# Patient Record
Sex: Female | Born: 1955 | Race: Black or African American | Hispanic: No | Marital: Single | Smoking: Former smoker
Health system: Southern US, Community
[De-identification: ages and names within clinical notes are randomized; demographics above are authoritative.]

## PROBLEM LIST (undated history)

## (undated) HISTORY — PX: BREAST EXCISIONAL BIOPSY: SUR124

---

## 1999-07-05 ENCOUNTER — Encounter: Admission: RE | Admit: 1999-07-05 | Discharge: 1999-07-05 | Payer: Self-pay

## 1999-07-11 ENCOUNTER — Encounter (INDEPENDENT_AMBULATORY_CARE_PROVIDER_SITE_OTHER): Payer: Self-pay | Admitting: *Deleted

## 1999-07-11 ENCOUNTER — Encounter: Admission: RE | Admit: 1999-07-11 | Discharge: 1999-07-11 | Payer: Self-pay | Admitting: Internal Medicine

## 1999-07-11 ENCOUNTER — Ambulatory Visit (HOSPITAL_COMMUNITY): Admission: RE | Admit: 1999-07-11 | Discharge: 1999-07-11 | Payer: Self-pay | Admitting: Internal Medicine

## 1999-07-11 LAB — CONVERTED CEMR LAB
CD4 Count: 260 microliters
CD4 T Cell Abs: 260

## 1999-08-16 ENCOUNTER — Encounter: Admission: RE | Admit: 1999-08-16 | Discharge: 1999-08-16 | Payer: Self-pay | Admitting: Infectious Diseases

## 1999-08-16 ENCOUNTER — Ambulatory Visit (HOSPITAL_COMMUNITY): Admission: RE | Admit: 1999-08-16 | Discharge: 1999-08-16 | Payer: Self-pay | Admitting: Internal Medicine

## 1999-08-16 ENCOUNTER — Encounter: Payer: Self-pay | Admitting: Infectious Diseases

## 1999-09-20 ENCOUNTER — Encounter: Admission: RE | Admit: 1999-09-20 | Discharge: 1999-09-20 | Payer: Self-pay | Admitting: Infectious Diseases

## 1999-09-20 ENCOUNTER — Ambulatory Visit (HOSPITAL_COMMUNITY): Admission: RE | Admit: 1999-09-20 | Discharge: 1999-09-20 | Payer: Self-pay | Admitting: Infectious Diseases

## 1999-10-04 ENCOUNTER — Encounter: Admission: RE | Admit: 1999-10-04 | Discharge: 1999-10-04 | Payer: Self-pay | Admitting: Infectious Diseases

## 1999-11-22 ENCOUNTER — Ambulatory Visit (HOSPITAL_COMMUNITY): Admission: RE | Admit: 1999-11-22 | Discharge: 1999-11-22 | Payer: Self-pay | Admitting: Infectious Diseases

## 1999-11-22 ENCOUNTER — Encounter: Admission: RE | Admit: 1999-11-22 | Discharge: 1999-11-22 | Payer: Self-pay | Admitting: Infectious Diseases

## 1999-12-06 ENCOUNTER — Encounter: Admission: RE | Admit: 1999-12-06 | Discharge: 1999-12-06 | Payer: Self-pay | Admitting: Infectious Diseases

## 2000-02-21 ENCOUNTER — Encounter: Admission: RE | Admit: 2000-02-21 | Discharge: 2000-02-21 | Payer: Self-pay | Admitting: Internal Medicine

## 2000-02-21 ENCOUNTER — Ambulatory Visit (HOSPITAL_COMMUNITY): Admission: RE | Admit: 2000-02-21 | Discharge: 2000-02-21 | Payer: Self-pay | Admitting: Infectious Diseases

## 2000-04-02 ENCOUNTER — Encounter: Admission: RE | Admit: 2000-04-02 | Discharge: 2000-04-02 | Payer: Self-pay | Admitting: Infectious Diseases

## 2000-04-10 ENCOUNTER — Ambulatory Visit (HOSPITAL_COMMUNITY): Admission: RE | Admit: 2000-04-10 | Discharge: 2000-04-10 | Payer: Self-pay | Admitting: Infectious Diseases

## 2000-04-10 ENCOUNTER — Encounter: Admission: RE | Admit: 2000-04-10 | Discharge: 2000-04-10 | Payer: Self-pay | Admitting: Infectious Diseases

## 2000-04-10 ENCOUNTER — Encounter (INDEPENDENT_AMBULATORY_CARE_PROVIDER_SITE_OTHER): Payer: Self-pay | Admitting: *Deleted

## 2000-04-10 LAB — CONVERTED CEMR LAB: Absolute CD4: 180 #/uL

## 2000-05-08 ENCOUNTER — Encounter: Admission: RE | Admit: 2000-05-08 | Discharge: 2000-05-08 | Payer: Self-pay | Admitting: Infectious Diseases

## 2000-06-19 ENCOUNTER — Encounter: Admission: RE | Admit: 2000-06-19 | Discharge: 2000-06-19 | Payer: Self-pay | Admitting: Infectious Diseases

## 2000-06-19 ENCOUNTER — Ambulatory Visit (HOSPITAL_COMMUNITY): Admission: RE | Admit: 2000-06-19 | Discharge: 2000-06-19 | Payer: Self-pay | Admitting: Infectious Diseases

## 2000-07-03 ENCOUNTER — Encounter: Admission: RE | Admit: 2000-07-03 | Discharge: 2000-07-03 | Payer: Self-pay | Admitting: Infectious Diseases

## 2000-09-03 ENCOUNTER — Ambulatory Visit (HOSPITAL_COMMUNITY): Admission: RE | Admit: 2000-09-03 | Discharge: 2000-09-03 | Payer: Self-pay | Admitting: Infectious Diseases

## 2000-09-03 ENCOUNTER — Encounter: Admission: RE | Admit: 2000-09-03 | Discharge: 2000-09-03 | Payer: Self-pay | Admitting: Infectious Diseases

## 2000-09-17 ENCOUNTER — Encounter: Admission: RE | Admit: 2000-09-17 | Discharge: 2000-09-17 | Payer: Self-pay | Admitting: Infectious Diseases

## 2000-11-13 ENCOUNTER — Encounter: Admission: RE | Admit: 2000-11-13 | Discharge: 2000-11-13 | Payer: Self-pay | Admitting: Internal Medicine

## 2000-12-17 ENCOUNTER — Encounter: Admission: RE | Admit: 2000-12-17 | Discharge: 2000-12-17 | Payer: Self-pay | Admitting: Infectious Diseases

## 2001-01-14 ENCOUNTER — Ambulatory Visit (HOSPITAL_COMMUNITY): Admission: RE | Admit: 2001-01-14 | Discharge: 2001-01-14 | Payer: Self-pay | Admitting: Infectious Diseases

## 2001-01-14 ENCOUNTER — Encounter: Admission: RE | Admit: 2001-01-14 | Discharge: 2001-01-14 | Payer: Self-pay | Admitting: Infectious Diseases

## 2001-01-14 ENCOUNTER — Encounter: Admission: RE | Admit: 2001-01-14 | Discharge: 2001-01-14 | Payer: Self-pay | Admitting: Internal Medicine

## 2001-03-04 ENCOUNTER — Encounter: Admission: RE | Admit: 2001-03-04 | Discharge: 2001-03-04 | Payer: Self-pay | Admitting: Infectious Diseases

## 2001-03-04 ENCOUNTER — Ambulatory Visit (HOSPITAL_COMMUNITY): Admission: RE | Admit: 2001-03-04 | Discharge: 2001-03-04 | Payer: Self-pay | Admitting: Infectious Diseases

## 2001-03-18 ENCOUNTER — Encounter: Admission: RE | Admit: 2001-03-18 | Discharge: 2001-03-18 | Payer: Self-pay | Admitting: Infectious Diseases

## 2001-04-27 ENCOUNTER — Ambulatory Visit (HOSPITAL_COMMUNITY): Admission: RE | Admit: 2001-04-27 | Discharge: 2001-04-27 | Payer: Self-pay | Admitting: Infectious Diseases

## 2001-04-27 ENCOUNTER — Encounter: Admission: RE | Admit: 2001-04-27 | Discharge: 2001-04-27 | Payer: Self-pay | Admitting: Infectious Diseases

## 2001-05-13 ENCOUNTER — Encounter: Admission: RE | Admit: 2001-05-13 | Discharge: 2001-05-13 | Payer: Self-pay | Admitting: Infectious Diseases

## 2001-08-30 ENCOUNTER — Ambulatory Visit (HOSPITAL_COMMUNITY): Admission: RE | Admit: 2001-08-30 | Discharge: 2001-08-30 | Payer: Self-pay | Admitting: Infectious Diseases

## 2001-08-30 ENCOUNTER — Encounter: Admission: RE | Admit: 2001-08-30 | Discharge: 2001-08-30 | Payer: Self-pay | Admitting: Internal Medicine

## 2001-09-09 ENCOUNTER — Encounter: Admission: RE | Admit: 2001-09-09 | Discharge: 2001-09-09 | Payer: Self-pay | Admitting: Infectious Diseases

## 2001-11-23 ENCOUNTER — Ambulatory Visit (HOSPITAL_COMMUNITY): Admission: RE | Admit: 2001-11-23 | Discharge: 2001-11-23 | Payer: Self-pay | Admitting: Infectious Diseases

## 2001-11-23 ENCOUNTER — Encounter: Admission: RE | Admit: 2001-11-23 | Discharge: 2001-11-23 | Payer: Self-pay | Admitting: Infectious Diseases

## 2001-12-20 ENCOUNTER — Encounter: Admission: RE | Admit: 2001-12-20 | Discharge: 2001-12-20 | Payer: Self-pay | Admitting: Internal Medicine

## 2001-12-20 ENCOUNTER — Ambulatory Visit (HOSPITAL_COMMUNITY): Admission: RE | Admit: 2001-12-20 | Discharge: 2001-12-20 | Payer: Self-pay | Admitting: Infectious Diseases

## 2002-03-04 ENCOUNTER — Encounter: Admission: RE | Admit: 2002-03-04 | Discharge: 2002-03-04 | Payer: Self-pay | Admitting: Infectious Diseases

## 2002-03-07 ENCOUNTER — Encounter: Admission: RE | Admit: 2002-03-07 | Discharge: 2002-03-07 | Payer: Self-pay | Admitting: Infectious Diseases

## 2002-05-05 ENCOUNTER — Encounter: Admission: RE | Admit: 2002-05-05 | Discharge: 2002-05-05 | Payer: Self-pay | Admitting: Infectious Diseases

## 2002-08-01 ENCOUNTER — Ambulatory Visit (HOSPITAL_COMMUNITY): Admission: RE | Admit: 2002-08-01 | Discharge: 2002-08-01 | Payer: Self-pay | Admitting: Infectious Diseases

## 2002-08-01 ENCOUNTER — Encounter: Admission: RE | Admit: 2002-08-01 | Discharge: 2002-08-01 | Payer: Self-pay | Admitting: Infectious Diseases

## 2002-08-11 ENCOUNTER — Encounter: Admission: RE | Admit: 2002-08-11 | Discharge: 2002-08-11 | Payer: Self-pay | Admitting: Infectious Diseases

## 2002-11-09 ENCOUNTER — Encounter: Payer: Self-pay | Admitting: Infectious Diseases

## 2002-11-09 ENCOUNTER — Encounter: Admission: RE | Admit: 2002-11-09 | Discharge: 2002-11-09 | Payer: Self-pay | Admitting: Infectious Diseases

## 2002-11-24 ENCOUNTER — Encounter: Admission: RE | Admit: 2002-11-24 | Discharge: 2002-11-24 | Payer: Self-pay | Admitting: Infectious Diseases

## 2003-03-09 ENCOUNTER — Ambulatory Visit (HOSPITAL_COMMUNITY): Admission: RE | Admit: 2003-03-09 | Discharge: 2003-03-09 | Payer: Self-pay | Admitting: Infectious Diseases

## 2003-03-09 ENCOUNTER — Encounter: Admission: RE | Admit: 2003-03-09 | Discharge: 2003-03-09 | Payer: Self-pay | Admitting: Infectious Diseases

## 2003-05-08 ENCOUNTER — Encounter: Admission: RE | Admit: 2003-05-08 | Discharge: 2003-05-08 | Payer: Self-pay | Admitting: Infectious Diseases

## 2003-05-08 ENCOUNTER — Ambulatory Visit (HOSPITAL_COMMUNITY): Admission: RE | Admit: 2003-05-08 | Discharge: 2003-05-08 | Payer: Self-pay | Admitting: Infectious Diseases

## 2003-06-08 ENCOUNTER — Encounter: Admission: RE | Admit: 2003-06-08 | Discharge: 2003-06-08 | Payer: Self-pay | Admitting: Infectious Diseases

## 2003-07-27 ENCOUNTER — Encounter: Admission: RE | Admit: 2003-07-27 | Discharge: 2003-07-27 | Payer: Self-pay | Admitting: Infectious Diseases

## 2003-10-11 ENCOUNTER — Ambulatory Visit (HOSPITAL_COMMUNITY): Admission: RE | Admit: 2003-10-11 | Discharge: 2003-10-11 | Payer: Self-pay | Admitting: Infectious Diseases

## 2003-10-11 ENCOUNTER — Encounter: Admission: RE | Admit: 2003-10-11 | Discharge: 2003-10-11 | Payer: Self-pay | Admitting: Infectious Diseases

## 2003-10-25 ENCOUNTER — Encounter: Admission: RE | Admit: 2003-10-25 | Discharge: 2003-10-25 | Payer: Self-pay | Admitting: Infectious Diseases

## 2003-11-06 ENCOUNTER — Ambulatory Visit (HOSPITAL_COMMUNITY): Admission: RE | Admit: 2003-11-06 | Discharge: 2003-11-06 | Payer: Self-pay | Admitting: Infectious Diseases

## 2003-12-14 ENCOUNTER — Encounter: Admission: RE | Admit: 2003-12-14 | Discharge: 2003-12-14 | Payer: Self-pay | Admitting: Infectious Diseases

## 2003-12-14 ENCOUNTER — Ambulatory Visit (HOSPITAL_COMMUNITY): Admission: RE | Admit: 2003-12-14 | Discharge: 2003-12-14 | Payer: Self-pay | Admitting: Infectious Diseases

## 2003-12-28 ENCOUNTER — Encounter: Admission: RE | Admit: 2003-12-28 | Discharge: 2003-12-28 | Payer: Self-pay | Admitting: Infectious Diseases

## 2004-03-21 ENCOUNTER — Ambulatory Visit (HOSPITAL_COMMUNITY): Admission: RE | Admit: 2004-03-21 | Discharge: 2004-03-21 | Payer: Self-pay | Admitting: Internal Medicine

## 2004-03-21 ENCOUNTER — Ambulatory Visit: Payer: Self-pay | Admitting: Infectious Diseases

## 2004-04-04 ENCOUNTER — Ambulatory Visit: Payer: Self-pay | Admitting: Infectious Diseases

## 2004-08-05 ENCOUNTER — Ambulatory Visit (HOSPITAL_COMMUNITY): Admission: RE | Admit: 2004-08-05 | Discharge: 2004-08-05 | Payer: Self-pay | Admitting: Infectious Diseases

## 2004-08-05 ENCOUNTER — Ambulatory Visit: Payer: Self-pay | Admitting: Infectious Diseases

## 2004-08-22 ENCOUNTER — Ambulatory Visit: Payer: Self-pay | Admitting: Infectious Diseases

## 2004-12-02 ENCOUNTER — Ambulatory Visit: Payer: Self-pay | Admitting: Infectious Diseases

## 2004-12-02 ENCOUNTER — Ambulatory Visit (HOSPITAL_COMMUNITY): Admission: RE | Admit: 2004-12-02 | Discharge: 2004-12-02 | Payer: Self-pay | Admitting: Infectious Diseases

## 2004-12-19 ENCOUNTER — Ambulatory Visit: Payer: Self-pay | Admitting: Infectious Diseases

## 2005-03-31 ENCOUNTER — Ambulatory Visit: Payer: Self-pay | Admitting: Infectious Diseases

## 2005-03-31 ENCOUNTER — Ambulatory Visit (HOSPITAL_COMMUNITY): Admission: RE | Admit: 2005-03-31 | Discharge: 2005-03-31 | Payer: Self-pay | Admitting: Infectious Diseases

## 2005-04-24 ENCOUNTER — Ambulatory Visit: Payer: Self-pay | Admitting: Infectious Diseases

## 2005-05-13 ENCOUNTER — Ambulatory Visit (HOSPITAL_COMMUNITY): Admission: RE | Admit: 2005-05-13 | Discharge: 2005-05-13 | Payer: Self-pay | Admitting: Infectious Diseases

## 2005-08-11 ENCOUNTER — Ambulatory Visit: Payer: Self-pay | Admitting: Infectious Diseases

## 2005-08-28 ENCOUNTER — Ambulatory Visit: Payer: Self-pay | Admitting: Infectious Diseases

## 2005-12-29 ENCOUNTER — Encounter (INDEPENDENT_AMBULATORY_CARE_PROVIDER_SITE_OTHER): Payer: Self-pay | Admitting: *Deleted

## 2005-12-29 ENCOUNTER — Encounter: Admission: RE | Admit: 2005-12-29 | Discharge: 2005-12-29 | Payer: Self-pay | Admitting: Infectious Diseases

## 2005-12-29 ENCOUNTER — Ambulatory Visit: Payer: Self-pay | Admitting: Infectious Diseases

## 2005-12-29 LAB — CONVERTED CEMR LAB
CD4 Count: 630 microliters
HIV 1 RNA Quant: 399 copies/mL

## 2006-01-08 ENCOUNTER — Ambulatory Visit: Payer: Self-pay | Admitting: Infectious Diseases

## 2006-04-16 ENCOUNTER — Encounter (INDEPENDENT_AMBULATORY_CARE_PROVIDER_SITE_OTHER): Payer: Self-pay | Admitting: *Deleted

## 2006-04-16 ENCOUNTER — Ambulatory Visit: Payer: Self-pay | Admitting: Infectious Diseases

## 2006-04-16 ENCOUNTER — Encounter: Admission: RE | Admit: 2006-04-16 | Discharge: 2006-04-16 | Payer: Self-pay | Admitting: Infectious Diseases

## 2006-04-16 LAB — CONVERTED CEMR LAB
ALT: 25 units/L (ref 0–40)
AST: 22 units/L (ref 0–37)
Albumin: 4.6 g/dL (ref 3.5–5.2)
Alkaline Phosphatase: 138 units/L — ABNORMAL HIGH (ref 39–117)
BUN: 13 mg/dL (ref 6–23)
Basophils Absolute: 0 10*3/uL (ref 0.0–0.1)
Basophils percent auto: 1 % (ref 0–1)
CD4 Count: 880 microliters
CO2: 26 meq/L (ref 19–32)
Calcium: 9.2 mg/dL (ref 8.4–10.5)
Chloride: 104 meq/L (ref 96–112)
Creatinine, Ser: 0.61 mg/dL (ref 0.40–1.20)
Eosinophils Absolute: 0.1 cells/mcL (ref 0.0–0.7)
Eosinophils Relative: 1 % (ref 0–5)
Glucose, Bld: 94 mg/dL (ref 70–99)
HCT: 40.5 % (ref 36.0–46.0)
HIV 1 RNA Quant: 82 copies/mL
HIV 1 RNA Quant: 82 copies/mL — ABNORMAL HIGH (ref <50)
HIV-1 RNA Quant, Log: 1.91 — ABNORMAL HIGH (ref <1.70)
Hemoglobin: 13.1 g/dL (ref 12.0–15.0)
Leukocyte count, blood: 6.7 10*9/L (ref 4.0–10.5)
Lymphocytes Relative: 42 % (ref 12–46)
Lymphs Abs: 2.8 10*3/uL (ref 0.7–3.3)
MCHC: 32.3 g/dL (ref 30.0–36.0)
MCV: 102.8 fL — ABNORMAL HIGH (ref 78.0–100.0)
Monocytes Absolute: 0.4 10*3/uL (ref 0.2–0.7)
Monocytes Relative: 5 % (ref 3–11)
Neutro Abs: 3.5 10*3/uL (ref 1.7–7.7)
Neutrophils Relative %: 51 % (ref 43–77)
Platelets: 283 10*3/uL (ref 150–400)
Potassium: 3.7 meq/L (ref 3.5–5.3)
RBC: 3.94 M/uL (ref 3.87–5.11)
RDW: 13.1 % (ref 11.5–14.0)
Sodium: 140 meq/L (ref 135–145)
Total Bilirubin: 0.4 mg/dL (ref 0.3–1.2)
Total Protein: 7.4 g/dL (ref 6.0–8.3)

## 2006-04-30 ENCOUNTER — Ambulatory Visit: Payer: Self-pay | Admitting: Infectious Diseases

## 2006-06-08 ENCOUNTER — Ambulatory Visit (HOSPITAL_COMMUNITY): Admission: RE | Admit: 2006-06-08 | Discharge: 2006-06-08 | Payer: Self-pay | Admitting: Obstetrics and Gynecology

## 2006-08-17 ENCOUNTER — Encounter: Admission: RE | Admit: 2006-08-17 | Discharge: 2006-08-17 | Payer: Self-pay | Admitting: Infectious Diseases

## 2006-08-17 ENCOUNTER — Ambulatory Visit: Payer: Self-pay | Admitting: Infectious Diseases

## 2006-08-17 ENCOUNTER — Encounter (INDEPENDENT_AMBULATORY_CARE_PROVIDER_SITE_OTHER): Payer: Self-pay | Admitting: *Deleted

## 2006-08-17 LAB — CONVERTED CEMR LAB
CD4 Count: 600 microliters
HIV 1 RNA Quant: 110 copies/mL

## 2006-08-24 ENCOUNTER — Encounter (INDEPENDENT_AMBULATORY_CARE_PROVIDER_SITE_OTHER): Payer: Self-pay | Admitting: *Deleted

## 2006-08-24 LAB — CONVERTED CEMR LAB

## 2006-08-28 DIAGNOSIS — E039 Hypothyroidism, unspecified: Secondary | ICD-10-CM | POA: Insufficient documentation

## 2006-08-28 DIAGNOSIS — B20 Human immunodeficiency virus [HIV] disease: Secondary | ICD-10-CM | POA: Insufficient documentation

## 2006-08-28 DIAGNOSIS — I10 Essential (primary) hypertension: Secondary | ICD-10-CM | POA: Insufficient documentation

## 2006-09-03 ENCOUNTER — Ambulatory Visit: Payer: Self-pay | Admitting: Infectious Diseases

## 2006-09-06 ENCOUNTER — Encounter (INDEPENDENT_AMBULATORY_CARE_PROVIDER_SITE_OTHER): Payer: Self-pay | Admitting: *Deleted

## 2006-12-24 ENCOUNTER — Encounter: Admission: RE | Admit: 2006-12-24 | Discharge: 2006-12-24 | Payer: Self-pay | Admitting: Infectious Diseases

## 2006-12-24 ENCOUNTER — Ambulatory Visit: Payer: Self-pay | Admitting: Infectious Diseases

## 2006-12-24 LAB — CONVERTED CEMR LAB
ALT: 26 units/L (ref 0–35)
AST: 23 units/L (ref 0–37)
Albumin: 4.3 g/dL (ref 3.5–5.2)
Alkaline Phosphatase: 139 units/L — ABNORMAL HIGH (ref 39–117)
BUN: 10 mg/dL (ref 6–23)
Basophils Absolute: 0.1 10*3/uL (ref 0.0–0.1)
Basophils Relative: 1 % (ref 0–1)
CO2: 25 meq/L (ref 19–32)
Calcium: 9.5 mg/dL (ref 8.4–10.5)
Chloride: 103 meq/L (ref 96–112)
Creatinine, Ser: 0.65 mg/dL (ref 0.40–1.20)
Eosinophils Absolute: 0.1 10*3/uL (ref 0.0–0.7)
Eosinophils Relative: 1 % (ref 0–5)
Glucose, Bld: 90 mg/dL (ref 70–99)
HCT: 40.2 % (ref 36.0–46.0)
HIV 1 RNA Quant: 359 copies/mL — ABNORMAL HIGH (ref <50)
HIV-1 RNA Quant, Log: 2.56 — ABNORMAL HIGH (ref <1.70)
Hemoglobin: 12.9 g/dL (ref 12.0–15.0)
Lymphocytes Relative: 38 % (ref 12–46)
Lymphs Abs: 2.9 10*3/uL (ref 0.7–3.3)
MCHC: 32.1 g/dL (ref 30.0–36.0)
MCV: 102.6 fL — ABNORMAL HIGH (ref 78.0–100.0)
Monocytes Absolute: 0.5 10*3/uL (ref 0.2–0.7)
Monocytes Relative: 6 % (ref 3–11)
Neutro Abs: 4.2 10*3/uL (ref 1.7–7.7)
Neutrophils Relative %: 54 % (ref 43–77)
Platelets: 339 10*3/uL (ref 150–400)
Potassium: 3.7 meq/L (ref 3.5–5.3)
RBC: 3.92 M/uL (ref 3.87–5.11)
RDW: 13.4 % (ref 11.5–14.0)
Sodium: 139 meq/L (ref 135–145)
Total Bilirubin: 0.4 mg/dL (ref 0.3–1.2)
Total Protein: 7.5 g/dL (ref 6.0–8.3)
WBC: 7.6 10*3/uL (ref 4.0–10.5)

## 2006-12-25 ENCOUNTER — Encounter: Payer: Self-pay | Admitting: Infectious Diseases

## 2006-12-25 LAB — CONVERTED CEMR LAB: CD4 Count: 840 microliters

## 2007-01-07 ENCOUNTER — Ambulatory Visit: Payer: Self-pay | Admitting: Infectious Diseases

## 2007-01-07 ENCOUNTER — Encounter (INDEPENDENT_AMBULATORY_CARE_PROVIDER_SITE_OTHER): Payer: Self-pay | Admitting: *Deleted

## 2007-01-08 ENCOUNTER — Telehealth: Payer: Self-pay | Admitting: Infectious Diseases

## 2007-05-06 ENCOUNTER — Ambulatory Visit: Payer: Self-pay | Admitting: Infectious Diseases

## 2007-05-06 ENCOUNTER — Encounter: Admission: RE | Admit: 2007-05-06 | Discharge: 2007-05-06 | Payer: Self-pay | Admitting: Infectious Diseases

## 2007-05-06 LAB — CONVERTED CEMR LAB
ALT: 17 units/L (ref 0–35)
AST: 21 units/L (ref 0–37)
Albumin: 4.4 g/dL (ref 3.5–5.2)
Alkaline Phosphatase: 144 units/L — ABNORMAL HIGH (ref 39–117)
BUN: 11 mg/dL (ref 6–23)
Basophils Absolute: 0.1 10*3/uL (ref 0.0–0.1)
Basophils Relative: 1 % (ref 0–1)
CO2: 27 meq/L (ref 19–32)
Calcium: 9.3 mg/dL (ref 8.4–10.5)
Chloride: 105 meq/L (ref 96–112)
Creatinine, Ser: 0.7 mg/dL (ref 0.40–1.20)
Eosinophils Absolute: 0 10*3/uL (ref 0.0–0.7)
Eosinophils Relative: 0 % (ref 0–5)
Glucose, Bld: 88 mg/dL (ref 70–99)
HCT: 39.3 % (ref 36.0–46.0)
HIV 1 RNA Quant: 562 copies/mL — ABNORMAL HIGH (ref <50)
HIV-1 RNA Quant, Log: 2.75 — ABNORMAL HIGH (ref <1.70)
Hemoglobin: 13 g/dL (ref 12.0–15.0)
Lymphocytes Relative: 31 % (ref 12–46)
Lymphs Abs: 2.4 10*3/uL (ref 0.7–3.3)
MCHC: 33.1 g/dL (ref 30.0–36.0)
MCV: 101.8 fL — ABNORMAL HIGH (ref 78.0–100.0)
Monocytes Absolute: 0.5 10*3/uL (ref 0.2–0.7)
Monocytes Relative: 7 % (ref 3–11)
Neutro Abs: 4.8 10*3/uL (ref 1.7–7.7)
Neutrophils Relative %: 62 % (ref 43–77)
Platelets: 316 10*3/uL (ref 150–400)
Potassium: 4 meq/L (ref 3.5–5.3)
RBC: 3.86 M/uL — ABNORMAL LOW (ref 3.87–5.11)
RDW: 13.1 % (ref 11.5–14.0)
Sodium: 143 meq/L (ref 135–145)
Total Bilirubin: 0.4 mg/dL (ref 0.3–1.2)
Total Protein: 7.6 g/dL (ref 6.0–8.3)
WBC: 7.7 10*3/uL (ref 4.0–10.5)

## 2007-05-20 ENCOUNTER — Ambulatory Visit: Payer: Self-pay | Admitting: Infectious Diseases

## 2007-05-20 LAB — CONVERTED CEMR LAB

## 2007-06-14 ENCOUNTER — Ambulatory Visit (HOSPITAL_COMMUNITY): Admission: RE | Admit: 2007-06-14 | Discharge: 2007-06-14 | Payer: Self-pay | Admitting: Infectious Diseases

## 2007-09-06 ENCOUNTER — Ambulatory Visit: Payer: Self-pay | Admitting: Infectious Diseases

## 2007-09-06 ENCOUNTER — Encounter: Admission: RE | Admit: 2007-09-06 | Discharge: 2007-09-06 | Payer: Self-pay | Admitting: Infectious Diseases

## 2007-09-06 LAB — CONVERTED CEMR LAB
ALT: 27 units/L (ref 0–35)
AST: 30 units/L (ref 0–37)
Albumin: 4.5 g/dL (ref 3.5–5.2)
Alkaline Phosphatase: 156 units/L — ABNORMAL HIGH (ref 39–117)
BUN: 18 mg/dL (ref 6–23)
Basophils Absolute: 0 10*3/uL (ref 0.0–0.1)
Basophils Relative: 0 % (ref 0–1)
Bilirubin Urine: NEGATIVE
CO2: 23 meq/L (ref 19–32)
Calcium: 9.1 mg/dL (ref 8.4–10.5)
Chloride: 105 meq/L (ref 96–112)
Cholesterol: 248 mg/dL — ABNORMAL HIGH (ref 0–200)
Creatinine, Ser: 0.84 mg/dL (ref 0.40–1.20)
Eosinophils Absolute: 0 10*3/uL (ref 0.0–0.7)
Eosinophils Relative: 1 % (ref 0–5)
Glucose, Bld: 88 mg/dL (ref 70–99)
HCT: 41.2 % (ref 36.0–46.0)
HDL: 99 mg/dL (ref >39)
HIV 1 RNA Quant: 220 copies/mL — ABNORMAL HIGH (ref <50)
HIV-1 RNA Quant, Log: 2.34 — ABNORMAL HIGH (ref <1.70)
Hemoglobin: 12.8 g/dL (ref 12.0–15.0)
Ketones, ur: NEGATIVE mg/dL
LDL Cholesterol: 125 mg/dL — ABNORMAL HIGH (ref 0–99)
Lymphocytes Relative: 31 % (ref 12–46)
Lymphs Abs: 2.5 10*3/uL (ref 0.7–4.0)
MCHC: 31.1 g/dL (ref 30.0–36.0)
MCV: 105.9 fL — ABNORMAL HIGH (ref 78.0–100.0)
Monocytes Absolute: 0.4 10*3/uL (ref 0.1–1.0)
Monocytes Relative: 5 % (ref 3–12)
Neutro Abs: 5.1 10*3/uL (ref 1.7–7.7)
Neutrophils Relative %: 63 % (ref 43–77)
Nitrite: POSITIVE — AB
Platelets: 309 10*3/uL (ref 150–400)
Potassium: 4.2 meq/L (ref 3.5–5.3)
Protein, ur: 30 mg/dL — AB
RBC: 3.89 M/uL (ref 3.87–5.11)
RDW: 13.2 % (ref 11.5–15.5)
Sodium: 140 meq/L (ref 135–145)
Specific Gravity, Urine: 1.022 (ref 1.005–1.03)
TSH: 0.89 microintl units/mL (ref 0.350–5.50)
Total Bilirubin: 0.2 mg/dL — ABNORMAL LOW (ref 0.3–1.2)
Total CHOL/HDL Ratio: 2.5
Total Protein: 7.3 g/dL (ref 6.0–8.3)
Triglycerides: 121 mg/dL (ref <150)
Urine Glucose: NEGATIVE mg/dL
Urobilinogen, UA: 0.2 (ref 0.0–1.0)
VLDL: 24 mg/dL (ref 0–40)
WBC: 8.2 10*3/uL (ref 4.0–10.5)
pH: 5.5 (ref 5.0–8.0)

## 2007-09-23 ENCOUNTER — Ambulatory Visit: Payer: Self-pay | Admitting: Infectious Diseases

## 2008-01-03 ENCOUNTER — Ambulatory Visit: Payer: Self-pay | Admitting: Infectious Diseases

## 2008-01-03 ENCOUNTER — Encounter: Admission: RE | Admit: 2008-01-03 | Discharge: 2008-01-03 | Payer: Self-pay | Admitting: Infectious Diseases

## 2008-01-03 LAB — CONVERTED CEMR LAB
ALT: 21 units/L (ref 0–35)
AST: 25 units/L (ref 0–37)
Albumin: 4.4 g/dL (ref 3.5–5.2)
Alkaline Phosphatase: 153 units/L — ABNORMAL HIGH (ref 39–117)
BUN: 13 mg/dL (ref 6–23)
Basophils Absolute: 0.1 10*3/uL (ref 0.0–0.1)
Basophils Relative: 1 % (ref 0–1)
CO2: 22 meq/L (ref 19–32)
Calcium: 9.5 mg/dL (ref 8.4–10.5)
Chloride: 103 meq/L (ref 96–112)
Creatinine, Ser: 0.71 mg/dL (ref 0.40–1.20)
Eosinophils Absolute: 0.1 10*3/uL (ref 0.0–0.7)
Eosinophils Relative: 1 % (ref 0–5)
Glucose, Bld: 107 mg/dL — ABNORMAL HIGH (ref 70–99)
HCT: 41.1 % (ref 36.0–46.0)
HIV 1 RNA Quant: 353 copies/mL — ABNORMAL HIGH (ref <50)
HIV-1 RNA Quant, Log: 2.55 — ABNORMAL HIGH (ref <1.70)
Hemoglobin: 12.9 g/dL (ref 12.0–15.0)
Lymphocytes Relative: 38 % (ref 12–46)
Lymphs Abs: 2.4 10*3/uL (ref 0.7–4.0)
MCHC: 31.4 g/dL (ref 30.0–36.0)
MCV: 106.5 fL — ABNORMAL HIGH (ref 78.0–100.0)
Monocytes Absolute: 0.4 10*3/uL (ref 0.1–1.0)
Monocytes Relative: 6 % (ref 3–12)
Neutro Abs: 3.5 10*3/uL (ref 1.7–7.7)
Neutrophils Relative %: 55 % (ref 43–77)
Platelets: 311 10*3/uL (ref 150–400)
Potassium: 4.3 meq/L (ref 3.5–5.3)
RBC: 3.86 M/uL — ABNORMAL LOW (ref 3.87–5.11)
RDW: 13.3 % (ref 11.5–15.5)
Sodium: 139 meq/L (ref 135–145)
Total Bilirubin: 0.2 mg/dL — ABNORMAL LOW (ref 0.3–1.2)
Total Protein: 7.7 g/dL (ref 6.0–8.3)
WBC: 6.5 10*3/uL (ref 4.0–10.5)

## 2008-01-20 ENCOUNTER — Ambulatory Visit: Payer: Self-pay | Admitting: Infectious Diseases

## 2008-05-01 ENCOUNTER — Ambulatory Visit: Payer: Self-pay | Admitting: Infectious Diseases

## 2008-05-01 LAB — CONVERTED CEMR LAB
ALT: 25 units/L (ref 0–35)
AST: 26 units/L (ref 0–37)
Albumin: 4.5 g/dL (ref 3.5–5.2)
Alkaline Phosphatase: 138 units/L — ABNORMAL HIGH (ref 39–117)
BUN: 8 mg/dL (ref 6–23)
Basophils Absolute: 0 10*3/uL (ref 0.0–0.1)
Basophils Relative: 0 % (ref 0–1)
CO2: 23 meq/L (ref 19–32)
Calcium: 9.8 mg/dL (ref 8.4–10.5)
Chloride: 104 meq/L (ref 96–112)
Creatinine, Ser: 0.71 mg/dL (ref 0.40–1.20)
Eosinophils Absolute: 0.1 10*3/uL (ref 0.0–0.7)
Eosinophils Relative: 1 % (ref 0–5)
Glucose, Bld: 96 mg/dL (ref 70–99)
HCT: 42.5 % (ref 36.0–46.0)
HIV 1 RNA Quant: 69 copies/mL — ABNORMAL HIGH (ref <50)
HIV-1 RNA Quant, Log: 1.84 — ABNORMAL HIGH (ref <1.70)
Hemoglobin: 13.6 g/dL (ref 12.0–15.0)
Lymphocytes Relative: 33 % (ref 12–46)
Lymphs Abs: 2.9 10*3/uL (ref 0.7–4.0)
MCHC: 32 g/dL (ref 30.0–36.0)
MCV: 104.9 fL — ABNORMAL HIGH (ref 78.0–100.0)
Monocytes Absolute: 0.6 10*3/uL (ref 0.1–1.0)
Monocytes Relative: 7 % (ref 3–12)
Neutro Abs: 5.1 10*3/uL (ref 1.7–7.7)
Neutrophils Relative %: 59 % (ref 43–77)
Platelets: 351 10*3/uL (ref 150–400)
Potassium: 4 meq/L (ref 3.5–5.3)
RBC: 4.05 M/uL (ref 3.87–5.11)
RDW: 13.6 % (ref 11.5–15.5)
Sodium: 142 meq/L (ref 135–145)
TSH: 2.414 microintl units/mL (ref 0.350–4.50)
Total Bilirubin: 0.2 mg/dL — ABNORMAL LOW (ref 0.3–1.2)
Total Protein: 7.6 g/dL (ref 6.0–8.3)
WBC: 8.6 10*3/uL (ref 4.0–10.5)

## 2008-05-18 ENCOUNTER — Ambulatory Visit: Payer: Self-pay | Admitting: Infectious Diseases

## 2008-06-15 ENCOUNTER — Ambulatory Visit (HOSPITAL_COMMUNITY): Admission: RE | Admit: 2008-06-15 | Discharge: 2008-06-15 | Payer: Self-pay | Admitting: Infectious Diseases

## 2008-11-06 ENCOUNTER — Ambulatory Visit: Payer: Self-pay | Admitting: *Deleted

## 2008-11-06 ENCOUNTER — Encounter: Payer: Self-pay | Admitting: Infectious Diseases

## 2008-11-06 LAB — CONVERTED CEMR LAB
HIV 1 RNA Quant: 417 copies/mL — ABNORMAL HIGH (ref <48)
HIV-1 RNA Quant, Log: 2.62 — ABNORMAL HIGH (ref <1.68)

## 2008-11-07 ENCOUNTER — Encounter: Payer: Self-pay | Admitting: Infectious Diseases

## 2008-11-07 LAB — CONVERTED CEMR LAB
ALT: 30 units/L (ref 0–35)
AST: 24 units/L (ref 0–37)
Albumin: 4.1 g/dL (ref 3.5–5.2)
Alkaline Phosphatase: 151 units/L — ABNORMAL HIGH (ref 39–117)
BUN: 10 mg/dL (ref 6–23)
Basophils Absolute: 0 10*3/uL (ref 0.0–0.1)
Basophils Relative: 1 % (ref 0–1)
CO2: 25 meq/L (ref 19–32)
Calcium: 8.9 mg/dL (ref 8.4–10.5)
Chloride: 105 meq/L (ref 96–112)
Creatinine, Ser: 0.64 mg/dL (ref 0.40–1.20)
Eosinophils Absolute: 0 10*3/uL (ref 0.0–0.7)
Eosinophils Relative: 0 % (ref 0–5)
GFR calc Af Amer: >60 mL/min (ref >60)
GFR calc non Af Amer: >60 mL/min (ref >60)
Glucose, Bld: 95 mg/dL (ref 70–99)
HCT: 38.3 % (ref 36.0–46.0)
Hemoglobin: 12.6 g/dL (ref 12.0–15.0)
Lymphocytes Relative: 31 % (ref 12–46)
Lymphs Abs: 2.7 10*3/uL (ref 0.7–4.0)
MCHC: 32.9 g/dL (ref 30.0–36.0)
MCV: 102.1 fL — ABNORMAL HIGH (ref 78.0–100.0)
Monocytes Absolute: 0.4 10*3/uL (ref 0.1–1.0)
Monocytes Relative: 5 % (ref 3–12)
Neutro Abs: 5.6 10*3/uL (ref 1.7–7.7)
Neutrophils Relative %: 64 % (ref 43–77)
Platelets: 359 10*3/uL (ref 150–400)
Potassium: 3.6 meq/L (ref 3.5–5.3)
RBC: 3.75 M/uL — ABNORMAL LOW (ref 3.87–5.11)
RDW: 13.1 % (ref 11.5–15.5)
Sodium: 141 meq/L (ref 135–145)
Total Bilirubin: 0.2 mg/dL — ABNORMAL LOW (ref 0.3–1.2)
Total Protein: 7.2 g/dL (ref 6.0–8.3)
WBC: 8.7 10*3/uL (ref 4.0–10.5)

## 2008-11-23 ENCOUNTER — Ambulatory Visit: Payer: Self-pay | Admitting: Infectious Diseases

## 2008-11-23 LAB — CONVERTED CEMR LAB: TSH: 1.738 microintl units/mL (ref 0.350–4.500)

## 2009-03-19 ENCOUNTER — Ambulatory Visit: Payer: Self-pay | Admitting: Infectious Diseases

## 2009-03-19 LAB — CONVERTED CEMR LAB
ALT: 23 units/L (ref 0–35)
AST: 13 units/L (ref 0–37)
Albumin: 4.4 g/dL (ref 3.5–5.2)
Alkaline Phosphatase: 125 units/L — ABNORMAL HIGH (ref 39–117)
BUN: 11 mg/dL (ref 6–23)
Basophils Absolute: 0 10*3/uL (ref 0.0–0.1)
Basophils Relative: 0 % (ref 0–1)
CO2: 23 meq/L (ref 19–32)
Calcium: 9.4 mg/dL (ref 8.4–10.5)
Chloride: 106 meq/L (ref 96–112)
Creatinine, Ser: 0.71 mg/dL (ref 0.40–1.20)
Eosinophils Absolute: 0.1 10*3/uL (ref 0.0–0.7)
Eosinophils Relative: 1 % (ref 0–5)
Glucose, Bld: 101 mg/dL — ABNORMAL HIGH (ref 70–99)
HCT: 40.6 % (ref 36.0–46.0)
HIV 1 RNA Quant: 123 copies/mL — ABNORMAL HIGH (ref <48)
HIV-1 RNA Quant, Log: 2.09 — ABNORMAL HIGH (ref <1.68)
Hemoglobin: 12.9 g/dL (ref 12.0–15.0)
Lymphocytes Relative: 29 % (ref 12–46)
Lymphs Abs: 2.8 10*3/uL (ref 0.7–4.0)
MCHC: 31.8 g/dL (ref 30.0–36.0)
MCV: 105.2 fL — ABNORMAL HIGH (ref >=78.0)
Monocytes Absolute: 0.4 10*3/uL (ref 0.1–1.0)
Monocytes Relative: 4 % (ref 3–12)
Neutro Abs: 6.4 10*3/uL (ref 1.7–7.7)
Neutrophils Relative %: 66 % (ref 43–77)
Platelets: 279 10*3/uL (ref 150–400)
Potassium: 4.1 meq/L (ref 3.5–5.3)
RBC: 3.86 M/uL — ABNORMAL LOW (ref 3.87–5.11)
RDW: 13.6 % (ref 11.5–15.5)
Sodium: 144 meq/L (ref 135–145)
Total Bilirubin: 0.2 mg/dL — ABNORMAL LOW (ref 0.3–1.2)
Total Protein: 7.6 g/dL (ref 6.0–8.3)
WBC: 9.7 10*3/uL (ref 4.0–10.5)

## 2009-03-29 ENCOUNTER — Ambulatory Visit: Payer: Self-pay | Admitting: Infectious Diseases

## 2009-06-19 ENCOUNTER — Ambulatory Visit (HOSPITAL_COMMUNITY): Admission: RE | Admit: 2009-06-19 | Discharge: 2009-06-19 | Payer: Self-pay | Admitting: Infectious Diseases

## 2009-08-16 ENCOUNTER — Ambulatory Visit: Payer: Self-pay | Admitting: Infectious Diseases

## 2009-08-16 LAB — CONVERTED CEMR LAB
ALT: 15 units/L (ref 0–35)
AST: 19 units/L (ref 0–37)
Albumin: 4.5 g/dL (ref 3.5–5.2)
Alkaline Phosphatase: 136 units/L — ABNORMAL HIGH (ref 39–117)
BUN: 14 mg/dL (ref 6–23)
Basophils Absolute: 0 10*3/uL (ref 0.0–0.1)
Basophils Relative: 0 % (ref 0–1)
CO2: 27 meq/L (ref 19–32)
Calcium: 9.1 mg/dL (ref 8.4–10.5)
Chloride: 102 meq/L (ref 96–112)
Cholesterol: 232 mg/dL — ABNORMAL HIGH (ref 0–200)
Creatinine, Ser: 0.71 mg/dL (ref 0.40–1.20)
Eosinophils Absolute: 0.1 10*3/uL (ref 0.0–0.7)
Eosinophils Relative: 1 % (ref 0–5)
Glucose, Bld: 98 mg/dL (ref 70–99)
HCT: 40 % (ref 36.0–46.0)
HDL: 100 mg/dL (ref >39)
HIV 1 RNA Quant: 445 copies/mL — ABNORMAL HIGH (ref <48)
HIV-1 RNA Quant, Log: 2.65 — ABNORMAL HIGH (ref <1.68)
Hemoglobin: 12.9 g/dL (ref 12.0–15.0)
LDL Cholesterol: 120 mg/dL — ABNORMAL HIGH (ref 0–99)
Lymphocytes Relative: 29 % (ref 12–46)
Lymphs Abs: 2.3 10*3/uL (ref 0.7–4.0)
MCHC: 32.3 g/dL (ref 30.0–36.0)
MCV: 106.4 fL — ABNORMAL HIGH (ref >=78.0)
Monocytes Absolute: 0.4 10*3/uL (ref 0.1–1.0)
Monocytes Relative: 5 % (ref 3–12)
Neutro Abs: 5.2 10*3/uL (ref 1.7–7.7)
Neutrophils Relative %: 65 % (ref 43–77)
Platelets: 338 10*3/uL (ref 150–400)
Potassium: 3.7 meq/L (ref 3.5–5.3)
RBC: 3.76 M/uL — ABNORMAL LOW (ref 3.87–5.11)
RDW: 13.2 % (ref 11.5–15.5)
Sodium: 140 meq/L (ref 135–145)
Total Bilirubin: 0.3 mg/dL (ref 0.3–1.2)
Total CHOL/HDL Ratio: 2.3
Total Protein: 7.6 g/dL (ref 6.0–8.3)
Triglycerides: 60 mg/dL (ref <150)
VLDL: 12 mg/dL (ref 0–40)
WBC: 8 10*3/uL (ref 4.0–10.5)

## 2009-11-20 ENCOUNTER — Encounter (INDEPENDENT_AMBULATORY_CARE_PROVIDER_SITE_OTHER): Payer: Self-pay | Admitting: *Deleted

## 2009-11-22 ENCOUNTER — Ambulatory Visit: Payer: Self-pay | Admitting: Infectious Diseases

## 2010-01-30 ENCOUNTER — Telehealth (INDEPENDENT_AMBULATORY_CARE_PROVIDER_SITE_OTHER): Payer: Self-pay | Admitting: *Deleted

## 2010-02-02 ENCOUNTER — Encounter: Payer: Self-pay | Admitting: Infectious Diseases

## 2010-02-06 ENCOUNTER — Ambulatory Visit: Payer: Self-pay | Admitting: Infectious Diseases

## 2010-02-06 LAB — CONVERTED CEMR LAB
ALT: 14 units/L (ref 0–35)
AST: 18 units/L (ref 0–37)
Albumin: 4.3 g/dL (ref 3.5–5.2)
Alkaline Phosphatase: 116 units/L (ref 39–117)
BUN: 15 mg/dL (ref 6–23)
Basophils Absolute: 0 10*3/uL (ref 0.0–0.1)
Basophils Relative: 0 % (ref 0–1)
CO2: 25 meq/L (ref 19–32)
Calcium: 9 mg/dL (ref 8.4–10.5)
Chloride: 103 meq/L (ref 96–112)
Creatinine, Ser: 0.72 mg/dL (ref 0.40–1.20)
Eosinophils Absolute: 0.1 10*3/uL (ref 0.0–0.7)
Eosinophils Relative: 1 % (ref 0–5)
Glucose, Bld: 93 mg/dL (ref 70–99)
HCT: 37.9 % (ref 36.0–46.0)
HIV 1 RNA Quant: <48 copies/mL (ref <48)
HIV-1 RNA Quant, Log: <1.68 (ref <1.68)
Hemoglobin: 12.8 g/dL (ref 12.0–15.0)
Lymphocytes Relative: 31 % (ref 12–46)
Lymphs Abs: 2.7 10*3/uL (ref 0.7–4.0)
MCHC: 33.8 g/dL (ref 30.0–36.0)
MCV: 101.6 fL — ABNORMAL HIGH (ref 78.0–100.0)
Monocytes Absolute: 0.4 10*3/uL (ref 0.1–1.0)
Monocytes Relative: 5 % (ref 3–12)
Neutro Abs: 5.5 10*3/uL (ref 1.7–7.7)
Neutrophils Relative %: 63 % (ref 43–77)
Platelets: 297 10*3/uL (ref 150–400)
Potassium: 3.7 meq/L (ref 3.5–5.3)
RBC: 3.73 M/uL — ABNORMAL LOW (ref 3.87–5.11)
RDW: 13.4 % (ref 11.5–15.5)
Sodium: 140 meq/L (ref 135–145)
TSH: 1.422 microintl units/mL (ref 0.350–4.500)
Total Bilirubin: 0.2 mg/dL — ABNORMAL LOW (ref 0.3–1.2)
Total Protein: 7.2 g/dL (ref 6.0–8.3)
WBC: 8.7 10*3/uL (ref 4.0–10.5)

## 2010-02-21 ENCOUNTER — Ambulatory Visit: Payer: Self-pay | Admitting: Infectious Diseases

## 2010-03-01 ENCOUNTER — Telehealth (INDEPENDENT_AMBULATORY_CARE_PROVIDER_SITE_OTHER): Payer: Self-pay | Admitting: *Deleted

## 2010-05-27 ENCOUNTER — Ambulatory Visit: Payer: Self-pay | Admitting: Infectious Diseases

## 2010-05-27 LAB — CONVERTED CEMR LAB
ALT: 21 units/L (ref 0–35)
AST: 23 units/L (ref 0–37)
Albumin: 4.5 g/dL (ref 3.5–5.2)
Alkaline Phosphatase: 127 units/L — ABNORMAL HIGH (ref 39–117)
BUN: 13 mg/dL (ref 6–23)
Basophils Absolute: 0 10*3/uL (ref 0.0–0.1)
Basophils Relative: 0 % (ref 0–1)
CO2: 27 meq/L (ref 19–32)
Calcium: 9.9 mg/dL (ref 8.4–10.5)
Chloride: 101 meq/L (ref 96–112)
Creatinine, Ser: 0.83 mg/dL (ref 0.40–1.20)
Eosinophils Absolute: 0.1 10*3/uL (ref 0.0–0.7)
Eosinophils Relative: 1 % (ref 0–5)
Glucose, Bld: 114 mg/dL — ABNORMAL HIGH (ref 70–99)
HCT: 41.7 % (ref 36.0–46.0)
HIV 1 RNA Quant: <20 copies/mL (ref <20)
HIV-1 RNA Quant, Log: <1.3 (ref <1.30)
Hemoglobin: 13.7 g/dL (ref 12.0–15.0)
Lymphocytes Relative: 28 % (ref 12–46)
Lymphs Abs: 3.1 10*3/uL (ref 0.7–4.0)
MCHC: 32.9 g/dL (ref 30.0–36.0)
MCV: 105.3 fL — ABNORMAL HIGH (ref 78.0–100.0)
Monocytes Absolute: 0.5 10*3/uL (ref 0.1–1.0)
Monocytes Relative: 5 % (ref 3–12)
Neutro Abs: 7.5 10*3/uL (ref 1.7–7.7)
Neutrophils Relative %: 67 % (ref 43–77)
Platelets: 334 10*3/uL (ref 150–400)
Potassium: 3.7 meq/L (ref 3.5–5.3)
RBC: 3.96 M/uL (ref 3.87–5.11)
RDW: 13.2 % (ref 11.5–15.5)
Sodium: 141 meq/L (ref 135–145)
Total Bilirubin: 0.2 mg/dL — ABNORMAL LOW (ref 0.3–1.2)
Total Protein: 7.9 g/dL (ref 6.0–8.3)
WBC: 11.3 10*3/uL — ABNORMAL HIGH (ref 4.0–10.5)

## 2010-06-13 ENCOUNTER — Ambulatory Visit: Payer: Self-pay | Admitting: Infectious Diseases

## 2010-06-25 ENCOUNTER — Ambulatory Visit (HOSPITAL_COMMUNITY)
Admission: RE | Admit: 2010-06-25 | Discharge: 2010-06-25 | Payer: Self-pay | Source: Home / Self Care | Attending: Infectious Diseases | Admitting: Infectious Diseases

## 2010-07-22 ENCOUNTER — Encounter (INDEPENDENT_AMBULATORY_CARE_PROVIDER_SITE_OTHER): Payer: Self-pay | Admitting: *Deleted

## 2010-07-28 LAB — CONVERTED CEMR LAB
ALT: 30 units/L (ref 0–35)
AST: 28 units/L (ref 0–37)
Albumin: 4.2 g/dL (ref 3.5–5.2)
Alkaline Phosphatase: 152 units/L — ABNORMAL HIGH (ref 39–117)
BUN: 8 mg/dL (ref 6–23)
Basophils Absolute: 0 10*3/uL (ref 0.0–0.1)
Basophils Relative: 0 % (ref 0–1)
CO2: 26 meq/L (ref 19–32)
Calcium: 9.5 mg/dL (ref 8.4–10.5)
Chloride: 107 meq/L (ref 96–112)
Cholesterol: 210 mg/dL — ABNORMAL HIGH (ref 0–200)
Creatinine, Ser: 0.68 mg/dL (ref 0.40–1.20)
Eosinophils Absolute: 0.1 10*3/uL (ref 0.0–0.7)
Eosinophils Relative: 1 % (ref 0–5)
Glucose, Bld: 93 mg/dL (ref 70–99)
HCT: 40 % (ref 36.0–46.0)
HDL: 75 mg/dL (ref >39)
HIV 1 RNA Quant: 110 copies/mL — ABNORMAL HIGH (ref <50)
HIV-1 RNA Quant, Log: 2.04 — ABNORMAL HIGH (ref <1.70)
Hemoglobin: 12.4 g/dL (ref 12.0–15.0)
LDL Cholesterol: 121 mg/dL — ABNORMAL HIGH (ref 0–99)
Lymphocytes Relative: 32 % (ref 12–46)
Lymphs Abs: 2.4 10*3/uL (ref 0.7–3.3)
MCHC: 31 g/dL (ref 30.0–36.0)
MCV: 107.2 fL — ABNORMAL HIGH (ref 78.0–100.0)
Monocytes Absolute: 0.5 10*3/uL (ref 0.2–0.7)
Monocytes Relative: 7 % (ref 3–11)
Neutro Abs: 4.3 10*3/uL (ref 1.7–7.7)
Neutrophils Relative %: 59 % (ref 43–77)
Platelets: 450 10*3/uL — ABNORMAL HIGH (ref 150–400)
Potassium: 4.1 meq/L (ref 3.5–5.3)
RBC: 3.73 M/uL — ABNORMAL LOW (ref 3.87–5.11)
RDW: 13.1 % (ref 11.5–14.0)
Sodium: 143 meq/L (ref 135–145)
Total Bilirubin: 0.3 mg/dL (ref 0.3–1.2)
Total CHOL/HDL Ratio: 2.8
Total Protein: 7.2 g/dL (ref 6.0–8.3)
Triglycerides: 72 mg/dL (ref <150)
VLDL: 14 mg/dL (ref 0–40)
WBC: 7.3 10*3/uL (ref 4.0–10.5)

## 2010-08-01 NOTE — Assessment & Plan Note (Signed)
Summary: F/U [MKJ]   CC:  4 month follow up.  History of Present Illness: Tamara Lucas is now 55 and is doing well with controlled HIV, viral load <48 and CD4 760, on Atripla and TSH is normal. BP is controlled. Will f/u 4 monmths.    Preventive Screening-Counseling & Management  Alcohol-Tobacco     Alcohol drinks/day: 0     Smoking Status: never     Year Quit: 9 years ago  Caffeine-Diet-Exercise     Caffeine use/day: 0     Does Patient Exercise: yes     Type of exercise: walking     Exercise (avg: min/session): >60     Times/week: 5  Safety-Violence-Falls     Seat Belt Use: yes   Current Allergies (reviewed today): ! SEPTRA Vital Signs:  Patient profile:   55 year old female Menstrual status:  hysterectomy Height:      61 inches (154.94 cm) Weight:      153.4 pounds (69.73 kg) BMI:     29.09 Temp:     98.3 degrees F oral Pulse rate:   55 / minute BP sitting:   125 / 78  (right arm)  Vitals Entered By: Baxter Hire) (February 21, 2010 10:08 AM) CC: 4 month follow up Pain Assessment Patient in pain? no      Nutritional Status BMI of 25 - 29 = overweight Nutritional Status Detail appetite is good per patient  Have you ever been in a relationship where you felt threatened, hurt or afraid?No   Does patient need assistance? Functional Status Self care Ambulation Normal   Physical Exam  General:  alert, well-developed, well-nourished, and well-hydrated.   Mouth:  fair dentition.   Lungs:  normal respiratory effort.   Heart:  normal rate.     Impression & Recommendations:  Problem # 1:  HIV DISEASE (ICD-042)  Orders: Est. Patient Level IV (99214)Future Orders: T-CBC w/Diff ([id number]) ... 06/20/2010 T-Comprehensive Metabolic Panel [phone]) ... 06/20/2010 T-CD4SP ([facility]) (CD4SP) ... 06/20/2010 T-HIV Viral Load [id number]) ... 06/20/2010 doing well in alll regards  Patient Instructions: 1)  Please schedule a follow-up appointment in 4  months. Schedule labs 2 weeks prior to appointment.

## 2010-08-01 NOTE — Miscellaneous (Signed)
Summary: CD4  Clinical Lists Changes  Observations: Added new observation of CD4 COUNT: 840 microliters (12/25/2006 10:47)

## 2010-08-01 NOTE — Assessment & Plan Note (Signed)
Summary: FU OV/VS   Chief Complaint:  f/u  need rf's on meds today.  History of Present Illness: Mida is doing well with rasonable suppression of HIV and stable CD4. Adherence reinforced. BP controlled and euthyroid by history and ROS.     Current Allergies (reviewed today): ! SEPTRA    Risk Factors: Tobacco use:  quit    Year quit:  9 years ago Alcohol use:  no  PAP Smear History:    Date of Last PAP Smear:  05/20/2007    Vital Signs:  Patient Profile:   55 Years Old Female Height:     61 inches (154.94 cm) Weight:      144 pounds (65.45 kg) BMI:     27.31 Temp:     97.6 degrees F (36.44 degrees C) oral Pulse rate:   48 / minute BP sitting:   115 / 74  Pt. in pain?   no  Vitals Entered By: Starleen Arms (September 23, 2007 11:51 AM)              Is Patient Diabetic? No Nutritional Status BMI of 25 - 29 = overweight  Does patient need assistance? Functional Status Self care Ambulation Normal     Physical Exam  General:     alert, well-developed, and well-nourished.   Mouth:     no gingival abnormalities, no dental plaque, and pharynx pink and moist.   Neck:     no masses.   Heart:     normal rate, regular rhythm, and no murmur.      Impression & Recommendations:  Problem # 1:  HIV DISEASE (ICD-042)  Her updated medication list for this problem includes:    Atripla 600-200-300 Mg Tabs (Efavirenz-emtricitab-tenofovir) .Marland Kitchen... Take 1 tablet by mouth once a day at night.  Future Orders: T-CBC w/Diff [id number]) ... 01/17/2008 T-Comprehensive Metabolic Panel [phone]) ... 01/17/2008 T-HIV Viral Load [phone]) ... 01/17/2008 Stable HIV disease and other problems controlled.T-CD4 [id number]) ... 01/17/2008   Future Orders: T-CBC w/Diff ([id number]) ... 01/17/2008 T-Comprehensive Metabolic Panel [phone]) ... 01/17/2008 T-HIV Viral Load [id number]) ... 01/17/2008 T-CD4 ([id number]) ... 01/17/2008    Patient  Instructions: 1)  Please schedule a follow-up appointment in 4 months. 2)  Be sure to return for lab work one (1) week before your next appointment as scheduled.    ]

## 2010-08-01 NOTE — Assessment & Plan Note (Signed)
Summary: FU OV/VS   Chief Complaint:  f/u .    Current Allergies (reviewed today): ! SEPTRA    Risk Factors: Tobacco use:  quit    Year quit:  9 years ago Alcohol use:  no  PAP Smear History:    Date of Last PAP Smear:  05/20/2007   Flu Vaccine Consent Questions     Do you have a history of severe allergic reactions to this vaccine? no    Any prior history of allergic reactions to egg and/or gelatin? no    Do you have a sensitivity to the preservative Thimersol? no    Do you have a past history of Guillan-Barre Syndrome? no    Do you currently have an acute febrile illness? no    Have you ever had a severe reaction to latex? no    Vaccine information given and explained to patient? yes    Are you currently pregnant? no    Lot Number:[id number]   Exp Date:12/27/2008   Site Given  Left Deltoid IM Starleen Arms CMA  May 18, 2008 12:00 PM   Vital Signs:  Patient Profile:   55 Years Old Female Height:     61 inches (154.94 cm) Weight:      149 pounds (67.73 kg) BMI:     28.26 Temp:     97.9 degrees F (36.61 degrees C) oral Pulse rate:   57 / minute BP sitting:   113 / 75  (left arm)  Pt. in pain?   no  Vitals Entered By: Starleen Arms CMA (May 18, 2008 11:54 AM)              Is Patient Diabetic? No Nutritional Status BMI of 25 - 29 = overweight Nutritional Status Detail nl  Have you ever been in a relationship where you felt threatened, hurt or afraid?No   Does patient need assistance? Functional Status Self care Ambulation Normal        Patient Instructions: 1)  Please schedule a follow-up appointment in 4 months. 2)  Be sure to return for lab work one (1) week before your next appointment as scheduled.   ]

## 2010-08-01 NOTE — Medication Information (Signed)
Summary: [facility]: RX Referral   [facility]: RX Referral   Imported By: Florinda Marker 03/06/2010 09:56:20  _____________________________________________________________________  External Attachment:    Type:   Image     Comment:   External Document

## 2010-08-01 NOTE — Miscellaneous (Signed)
Summary: Orders Update  Clinical Lists Changes  Orders: Added new Test order of T-CBC w/Diff [phone]) - Signed Added new Test order of T-CD4 [id number]) - Signed Added new Test order of T-Comprehensive Metabolic Panel [id number]) - Signed Added new Test order of T-HIV Viral Load [phone]) - Signed  Appended Document: Orders Update Ordered by Dr. Lina Sayre

## 2010-08-01 NOTE — Assessment & Plan Note (Signed)
Summary: Tamara Lucas Fairly Flowsheet update

## 2010-08-01 NOTE — Assessment & Plan Note (Signed)
Summary: FU VISIT/VS   Chief Complaint:  f/u ov.  History of Present Illness: Tamara Lucas is without complaints and is doing well. Adhering to T4 replacement and her Atripla. CD4 is 960 and HIV 556. She misses about one in ten doses of AVRs and encouraged higher compliance. Will continue present regeimen.  Current Allergies: ! SEPTRA    Risk Factors: Tobacco use:  quit    Year quit:  9 years ago Alcohol use:  no  PAP Smear History:     Date of Last PAP Smear:  05/20/2007    Results:  HYST 1976, due to bleeding    Vital Signs:  Patient Profile:   55 Years Old Female Height:     61 inches (154.94 cm) Weight:      136.3 pounds BMI:     25.85 Temp:     97.7 degrees F oral Pulse rate:   60 / minute BP sitting:   102 / 75  (left arm)  Pt. in pain?   no  Vitals Entered By: Starleen Arms (May 20, 2007 12:09 PM)              Is Patient Diabetic? No Nutritional Status BMI of 19 -24 = normal Domestic Violence Intervention none  Does patient need assistance? Functional Status Self care Ambulation Normal   Last PAP Date 05/30/1975  Total Hysterectomy according to pt.  Physical Exam  General:     alert and well-developed.   Head:     normocephalic.   Eyes:     pupils equal and pupils round.   Mouth:     pharynx pink and moist.   Chest Wall:     no mass.      Impression & Recommendations:  Problem # 1:  HIV DISEASE (ICD-042)  Her updated medication list for this problem includes:    Atripla 600-200-300 Mg Tabs (Efavirenz-emtricitab-tenofovir) .Marland Kitchen... Take 1 tablet by mouth once a day at night.  Reasonably well controlled with emphais onf beter ARV adherence. Will continue present regimen.  Orders: Est. Patient Level IV (16109)  Future Orders: T-CD4 ([id number]) ... 08/23/2007 T-CBC w/Diff ([id number]) ... 08/23/2007 T-Comprehensive Metabolic Panel [id number]) ... 08/23/2007 T-HIV Viral Load [id number]) ... 08/23/2007 T-Lipid Profile  [id number]) ... 08/23/2007 T-RPR (Syphilis) [id number]) ... 08/23/2007 T-Urinalysis ([id number]) ... 08/23/2007  Her updated medication list for this problem includes:    Atripla 600-200-300 Mg Tabs (Efavirenz-emtricitab-tenofovir) .Marland Kitchen... Take 1 tablet by mouth once a day at night.   Problem # 2:  HYPERTENSION (ICD-401.9)  Her updated medication list for this problem includes:    Enalapril-hydrochlorothiazide 10-25 Mg Tabs (Enalapril-hydrochlorothiazide) .Marland Kitchen... Take 1 tablet by mouth once a day   Other Orders: Influenza Vaccine NON MCR (25956)  Future Orders: T-TSH ([id number]) ... 08/23/2007     ]  Influenza Vaccine    Vaccine Type: Fluvax Non-MCR    Site: left deltoid    Mfr: novartis    Dose: 0.5 ml    Route: IM    Given by: Starleen Arms    Exp. Date: [id number]    Lot #: [id number]    VIS given: 01/21/07 version given May 20, 2007.  Flu Vaccine Consent Questions    Do you have a history of severe allergic reactions to this vaccine? no    Any prior history of allergic reactions to egg and/or gelatin? no    Do you have a sensitivity to the preservative Thimersol? no    Do you have a past history of Guillan-Barre Syndrome?  no    Do you currently have an acute febrile illness? no    Have you ever had a severe reaction to latex? no    Vaccine information given and explained to patient? yes    Are you currently pregnant? no

## 2010-08-01 NOTE — Assessment & Plan Note (Signed)
Summary: f/u ov   CC:  follow-up visit.  History of Present Illness: Tamara Lucas is doing well with suppressed HIV on Atripla and TSH is normal. She has no complaints and continues on anti HTN Rx, T4 replacement and Atripla.  Preventive Screening-Counseling & Management  Alcohol-Tobacco     Alcohol drinks/day: 0     Smoking Status: never  Caffeine-Diet-Exercise     Caffeine use/day: 1-2 glasses of tea per month     Does Patient Exercise: yes     Type of exercise: walking     Exercise (avg: min/session): >60     Times/week: 5  Safety-Violence-Falls     Seat Belt Use: yes   Current Allergies (reviewed today): ! SEPTRA Vital Signs:  Patient profile:   55 year old female Height:      61 inches (154.94 cm) Weight:      147.8 pounds (67.18 kg) BMI:     28.03 Temp:     98.1 degrees F (36.72 degrees C) oral Pulse rate:   65 / minute BP sitting:   110 / 67  (left arm)  Vitals Entered By: Baxter Hire) (Nov 23, 2008 10:02 AM) CC: follow-up visit Is Patient Diabetic? No Pain Assessment Patient in pain? no      Nutritional Status BMI of 25 - 29 = overweight Nutritional Status Detail appetite is good per patient  Have you ever been in a relationship where you felt threatened, hurt or afraid?No   Does patient need assistance? Functional Status Self care Ambulation Normal   Physical Exam  General:  Well-developed,well-nourished,in no acute distress; alert,appropriate and cooperative throughout examination Mouth:  Oral mucosa and oropharynx without lesions or exudates.  Teeth in good repair. Lungs:  Normal respiratory effort, chest expands symmetrically. Lungs are clear to auscultation, no crackles or wheezes. Heart:  Normal rate and regular rhythm. S1 and S2 normal without gallop, murmur, click, rub or other extra sounds.   Impression & Recommendations:  Problem # 1:  HIV DISEASE (ICD-042)  Orders: Est. Patient Level IV (99214)Future Orders: T-CBC w/Diff  ([id number]) ... 01/29/2011 T-CD4 ([id number]) ... 01/29/2011 T-HIV Viral Load [id number]) ... 01/29/2011 T-Comprehensive Metabolic Panel [id number]) ... 01/29/2011 Suppresed and doing well.  Other Orders: T-TSH ([id number])  Patient Instructions: 1)  Please schedule a follow-up appointment in 4 months. 2)  Be sure to return for lab work one (1) week before your next appointment as scheduled. Prescriptions: ENALAPRIL-HYDROCHLOROTHIAZIDE 10-25 MG TABS (ENALAPRIL-HYDROCHLOROTHIAZIDE) Take 1 tablet by mouth once a day  #30 Tablet x 6   Entered by:   Baxter Hire)   Authorized by:   Lina Sayre MD   Signed by:   Kathi Simpers [facility]) on 11/23/2008   Method used:   Print then Give to Patient   RxID:   [id number] SYNTHROID 125 MCG TABS (LEVOTHYROXINE SODIUM) Take 1 tablet by mouth once a day  #30 Tablet x 2   Entered by:   Baxter Hire)   Authorized by:   Lina Sayre MD   Signed by:   Kathi Simpers [facility]) on 11/23/2008   Method used:   Print then Give to Patient   RxID:   [id number] ATRIPLA 600-200-300 MG TABS (EFAVIRENZ-EMTRICITAB-TENOFOVIR) Take 1 tablet by mouth once a day at night.  #30 x 11   Entered by:   Kathi Simpers [facility])   Authorized by:   Lina Sayre MD   Signed by:   Kathi Simpers [facility]) on 11/23/2008   Method used:   Print  then Give to Patient   RxID:   [id number]

## 2010-08-01 NOTE — Miscellaneous (Signed)
Summary: RW UPDATE  Clinical Lists Changes  Observations: Added new observation of INCOMESOURCE: UNKNOWN (07/22/2010 16:22)

## 2010-08-01 NOTE — Assessment & Plan Note (Signed)
Summary: FU OV/VS   Chief Complaint:  routine f/u and med refill.  History of Present Illness: Tamara Lucas is here for HIV f/u and is doing well without any complaints. She takes about 90% of her Atripla and importance of appraoching 100% adherence emphasized. She has a partner and always uses condoms. She has controlled BP and takes Synthroid faithfully. Will check TSH next visit.    Current Allergies (reviewed today): ! SEPTRA    Risk Factors: Tobacco use:  quit    Year quit:  9 years ago Alcohol use:  no  PAP Smear History:    Date of Last PAP Smear:  05/20/2007    Vital Signs:  Patient Profile:   55 Years Old Female Height:     61 inches (154.94 cm) Weight:      150.2 pounds (68.27 kg) BMI:     28.48 Temp:     98.1 degrees F (36.72 degrees C) oral Pulse rate:   59 / minute BP sitting:   113 / 70  (right arm)  Pt. in pain?   no  Vitals Entered By: Krystal Eaton Duncan Dull) (January 20, 2008 10:07 AM)              Is Patient Diabetic? No Nutritional Status BMI of 25 - 29 = overweight  Have you ever been in a relationship where you felt threatened, hurt or afraid?No   Does patient need assistance? Functional Status Self care Ambulation Normal     Physical Exam  General:     alert, well-developed, and well-nourished.   Mouth:     pharynx pink and moist and fair dentition.   Neck:     no masses.   Lungs:     normal breath sounds.   Heart:     regular rhythm, no murmur, no rub, and no JVD.   Neurologic:     alert & oriented X3.   Skin:     turgor normal, color normal, and no rashes.   Cervical Nodes:     No lymphadenopathy noted Axillary Nodes:     No palpable lymphadenopathy    Impression & Recommendations:  Problem # 1:  HIV DISEASE (ICD-042)  Her updated medication list for this problem includes:    Atripla 600-200-300 Mg Tabs (Efavirenz-emtricitab-tenofovir) .Marland Kitchen... Take 1 tablet by mouth once a day at night.  Orders: Est. Patient Level IV  (66063) Emphasized importance of improving Atriplaadherence sinc HIV loads are 100-300 range. CD4 is generally stable. Rtc 4-5 months.  Other Orders: T-CBC w/Diff [id number]) T-CD4 [id number]) T-Comprehensive Metabolic Panel [id number]) T-HIV Viral Load [id number]) T-TSH ([phone])   Patient Instructions: 1)  Please schedule a follow-up appointment in 4 months. 2)  Be sure to return for lab work one (1) week before your next appointment as scheduled.   ]    Impression & Recommendations:  Problem # 1:  HIV DISEASE (ICD-042)  Her updated medication list for this problem includes:    Atripla 600-200-300 Mg Tabs (Efavirenz-emtricitab-tenofovir) .Marland Kitchen... Take 1 tablet by mouth once a day at night.  Orders: Est. Patient Level IV (54627) Emphasized importance of improving Atriplaadherence sinc HIV loads are 100-300 range. CD4 is generally stable. Rtc 4-5 months.  Other Orders: T-CBC w/Diff [id number]) T-CD4 [id number]) T-Comprehensive Metabolic Panel [id number]) T-HIV Viral Load [id number]) T-TSH [phone])

## 2010-08-01 NOTE — Progress Notes (Signed)
Summary: Pharmacy unable to reach patient.--pt returned call  Phone Note Outgoing Call   Call placed by: Paulo Fruit  BS,CPht II,MPH,  March 01, 2010 11:17 AM Call placed to: Patient Details for Reason: pharmacy unable to reach patient Summary of Call: left message for patient to call office. Initial call taken by: Paulo Fruit  BS,CPht II,MPH,  March 01, 2010 11:18 AM Caller: [facility] Details for Reason: unable to reach pt to set up refill delivery Summary of Call: Re[facility] from [facility] stating that they have been uanble to reach patient.  Her medication she obtains through them will cost her $35. Initial call taken by: Paulo Fruit  BS,CPht II,MPH,  March 01, 2010 11:17 AM  Follow-up for Phone Call        Patient returned call and stated that [facility] and that [facility] will arrived to her on 03/06/10. Follow-up by: Paulo Fruit  BS,CPht II,MPH,  March 01, 2010 4:51 PM

## 2010-08-01 NOTE — Miscellaneous (Signed)
Summary: HIPAA Restrictions  HIPAA Restrictions   Imported By: Florinda Marker 05/02/2008 15:27:43  _____________________________________________________________________  External Attachment:    Type:   Image     Comment:   External Document

## 2010-08-01 NOTE — Assessment & Plan Note (Signed)
Summary: CHECKUP/SB.   CC:  follow-up visit.  Preventive Screening-Counseling & Management  Alcohol-Tobacco     Alcohol drinks/day: 0     Smoking Status: quit  Caffeine-Diet-Exercise     Caffeine use/day: yes     Does Patient Exercise: yes     Type of exercise: walking     Exercise (avg: min/session): >60     Times/week: 5  Hep-HIV-STD-Contraception     HIV Risk: no risk noted  Safety-Violence-Falls     Seat Belt Use: yes  Comments: declined condoms      Sexual History:  n/a.        Drug Use:  never.     Current Allergies (reviewed today): ! SEPTRA Social History: Sexual History:  n/a Drug Use:  never  Additional History Menstrual Status:  hysterectomy  Vital Signs:  Patient profile:   55 year old female Menstrual status:  hysterectomy Height:      61 inches (154.94 cm) Weight:      150.9 pounds (68.59 kg) BMI:     28.62 Temp:     98.3 degrees F (36.83 degrees C) oral Pulse rate:   63 / minute BP sitting:   109 / 72  (left arm) Cuff size:   regular  Vitals Entered By: Jennet Maduro RN (March 29, 2009 3:21 PM) CC: follow-up visit Is Patient Diabetic? No Pain Assessment Patient in pain? no      Nutritional Status BMI of 25 - 29 = overweight Nutritional Status Detail appetite "GOOD"  Have you ever been in a relationship where you felt threatened, hurt or afraid?No   Does patient need assistance? Functional Status Self care Ambulation Normal Comments no missed doses of rxes LMP - Reliable? YES     Menstrual Status hysterectomy Last PAP Result HYST 1976, due to bleeding    Other Orders: Est. Patient Level IV (65784) Influenza Vaccine NON MCR (69629) Future Orders: T-CBC w/Diff ([id number]) ... 07/23/2009 T-CD4SP ([facility]) (CD4SP) ... 07/23/2009 T-Comprehensive Metabolic Panel [id number]) ... 07/23/2009 T-HIV Viral Load [id number]) ... 07/23/2009 T-Lipid Profile [id number]) ... 07/23/2009 T-RPR (Syphilis) [id number]) ...  07/23/2009  Patient Instructions: 1)  Please schedule a follow-up appointment in 4-5 months.  Please make a lab appointment 1 week prior to the MD appointment.  Please call the office in January 2011 to schedule these appointments. Process Orders Check Orders Results:     [facility]: ABN not required for this insurance Tests Sent for requisitioning (September 20, 2009 3:32 PM):     07/23/2009: [facility] -- T-CBC w/Diff [[id number]] (signed)     07/23/2009: [facility] -- T-Comprehensive Metabolic Panel [[id number]] (signed)     07/23/2009: [facility] -- T-HIV Viral Load [phone] (signed)     07/23/2009: [facility] -- T-Lipid Profile [phone] (signed)     07/23/2009: [facility] -- T-RPR (Syphilis) [phone] (signed)    Influenza Vaccine    Vaccine Type: Fluvax Non-MCR    Site: left deltoid    Mfr: novartis    Dose: 0.5 ml    Route: IM    Given by: Jennet Maduro RN    Exp. Date: 08/28/2009    Lot #: [id number]    VIS given: 01/21/07 version given March 29, 2009.  Flu Vaccine Consent Questions    Do you have a history of severe allergic reactions to this vaccine? no    Any prior history of allergic reactions to egg and/or gelatin? no    Do you  have a sensitivity to the preservative Thimersol? no    Do you have a past history of Guillan-Barre Syndrome? no    Do you currently have an acute febrile illness? no    Have you ever had a severe reaction to latex? no    Vaccine information given and explained to patient? yes    Are you currently pregnant? no

## 2010-08-01 NOTE — Progress Notes (Signed)
Summary: new script/new specialty pharmacy  Phone Note From Pharmacy   Caller: [facility] Reason for Call: Needs renewal Details for Reason: New pharmacy for patient Summary of Call: Pharmacy left message requesting a new prescription for patient's Atripla. This is a new specialty mail order pharmacy for patient.  They need strength, directions, quantity,and refills called in to [phone].  Patient's Reference number if calling in scripts is [phone]. Initial call taken by: Paulo Fruit  BS,CPht II,MPH,  January 30, 2010 2:42 PM    Prescriptions: ATRIPLA 600-200-300 MG TABS (EFAVIRENZ-EMTRICITAB-TENOFOVIR) Take 1 tablet by mouth once a day at night.  #30 x 8   Entered by:   Paulo Fruit  BS,CPht II,MPH   Authorized by:   Lina Sayre MD   Signed by:   Paulo Fruit  BS,CPht II,MPH on 01/30/2010   Method used:   Telephoned to ...         RxID:   [id number]  Verbally spoke to pharmacist who read back the order. Paulo Fruit  BS,CPht II,MPH  January 30, 2010 2:47 PM

## 2010-08-01 NOTE — Progress Notes (Signed)
Summary: clarification of written rx/tl/dde  Phone Note From Pharmacy   [facility] Reason for Call: Cannot read prescription Details for Reason: enalapril/HCTZ rx clarification of dose on written rx.  Rx clarification supplied Initial call taken by: Jennet Maduro RN,  January 08, 2007 3:10 PM

## 2010-08-01 NOTE — Assessment & Plan Note (Signed)
Summary: FU/NBS   Chief Complaint:  follow up visit.  History of Present Illness: Tamara Lucas is clinically doing well and adhering to Atripla. Her HIV is dectable at 353 copies and remphasized adherence. CD4 is 840. Overall doing pretty well.  Current Allergies (reviewed today): ! SEPTRA    Risk Factors:  Tobacco use:  quit    Year quit:  9 years ago Alcohol use:  no  PAP Smear History:    Date of Last PAP Smear:  08/24/2006    Vital Signs:  Patient Profile:   55 Years Old Female Height:     61 inches (154.94 cm) Weight:      141.9 pounds (64.50 kg) BMI:     26.91 Temp:     98.4 degrees F (36.89 degrees C) oral Pulse rate:   66 / minute BP sitting:   103 / 74  (right arm)  Pt. in pain?   no  Vitals Entered By: Geannie Risen RN (January 07, 2007 11:39 AM)              Is Patient Diabetic? No Nutritional Status BMI of 25 - 29 = overweight  Have you ever been in a relationship where you felt threatened, hurt or afraid?No   Does patient need assistance? Functional Status Self care Ambulation Normal   Physical Exam  General:     well-developed and well-nourished.   Mouth:     pharynx pink and moist.      Impression & Recommendations:  Problem # 1:  HIV DISEASE (ICD-042)  Her updated medication list for this problem includes:    Atripla 600-200-300 Mg Tabs (Efavirenz-emtricitab-tenofovir) .Marland Kitchen... Take 1 tablet by mouth once a day at night.  Orders: Est. Patient Level III (19147) Reemphasized adherence given viral blip. RTC in 4 months.  Problem # 2:  HYPERTENSION (ICD-401.9)  Her updated medication list for this problem includes:    Enalapril-hydrochlorothiazide 10-25 Mg Tabs (Enalapril-hydrochlorothiazide) .Marland Kitchen... Take 1 tablet by mouth once a day Last TSH normal 6 months ago.   Patient Instructions: 1)  Please schedule a follow-up appointment in 4months. 2)  Be sure to return for lab work one (1) week before your next appointment as scheduled.   ]

## 2010-08-01 NOTE — Assessment & Plan Note (Signed)
Summary: F/U [MKJ]   CC:  follow-up visit, lab results, no problems, and Hypertension Management.  History of Present Illness: Tamara Lucas is doing very well and and has no complaints. Her HIV is<20 copies and CD41180. Will check thyroid function next visit and continue present meds.   Hypertension History:      Positive major cardiovascular risk factors include female age 57 years old or older and hypertension.  Negative major cardiovascular risk factors include non-tobacco-user status.    Preventive Screening-Counseling & Management  Alcohol-Tobacco     Alcohol drinks/day: 0     Smoking Status: never     Year Quit: 9 years ago  Caffeine-Diet-Exercise     Caffeine use/day: 0     Does Patient Exercise: yes     Type of exercise: walking     Exercise (avg: min/session): >60     Times/week: 5  Hep-HIV-STD-Contraception     HIV Risk: no risk noted     STD Risk Counseling: to avoid increased STD risk  Safety-Violence-Falls     Seat Belt Use: yes      Sexual History:  n/a.        Drug Use:  never.     Current Allergies (reviewed today): ! SEPTRA Vital Signs:  Patient profile:   55 year old female Menstrual status:  hysterectomy Height:      61 inches (154.94 cm) Weight:      154.4 pounds (70.18 kg) BMI:     29.28 Temp:     98.2 degrees F (36.78 degrees C) oral Pulse rate:   55 / minute BP sitting:   112 / 75  (right arm)  Vitals Entered By: Wendall Mola CMA Duncan Dull) (June 13, 2010 10:19 AM) CC: follow-up visit, lab results, no problems, Hypertension Management Is Patient Diabetic? No Pain Assessment Patient in pain? no      Nutritional Status BMI of 25 - 29 = overweight Nutritional Status Detail appetite "good"  Have you ever been in a relationship where you felt threatened, hurt or afraid?No   Does patient need assistance? Functional Status Self care Ambulation Normal Comments no missed doses of meds per pt.   Physical Exam  General:   alert, well-developed, and well-hydrated.   Mouth:  pharynx pink and moist and poor dentition.   Neck:  No deformities, masses, or tenderness noted. Lungs:  Normal respiratory effort, chest expands symmetrically. Lungs are clear to auscultation, no crackles or wheezes. Heart:  Normal rate and regular rhythm. S1 and S2 normal without gallop, murmur, click, rub or other extra sounds.   Impression & Recommendations:  Problem # 1:  HIV DISEASE (ICD-042)  Orders: Est. Patient Level IV (99214)Future Orders: T-CBC w/Diff ([id number]) ... 09/30/2010 T-CD4SP ([facility]) (CD4SP) ... 09/30/2010 T-Comprehensive Metabolic Panel [phone]) ... 09/30/2010 T-HIV Viral Load [phone]) ... 09/30/2010 Doing well and will see in 4 months.  Other Orders: Influenza Vaccine NON MCR (62952) Future Orders: T-TSH ([id number]) ... 09/30/2010  Hypertension Assessment/Plan:      The patient's hypertensive risk group is category B: At least one risk factor (excluding diabetes) with no target organ damage.  Her calculated 10 year risk of coronary heart disease is 3 %.  Today's blood pressure is 112/75.     Patient Instructions: 1)  Please schedule a follow-up appointment in 4 months. 2)  Be sure to return for lab work one (2) week before your next appointment as scheduled.     Immunizations Administered:  Influenza Vaccine # 1:  Vaccine Type: Fluvax Non-MCR    Site: left deltoid    Mfr: novartis    Dose: 0.5 ml    Route: IM    Given by: Starleen Arms CMA    Exp. Date: 09/29/2010    Lot #: [id number]    VIS given: 01/22/10 version given June 13, 2010.  Flu Vaccine Consent Questions:    Do you have a history of severe allergic reactions to this vaccine? no    Any prior history of allergic reactions to egg and/or gelatin? no    Do you have a sensitivity to the preservative Thimersol? no    Do you have a past history of Guillan-Barre Syndrome? no    Do you currently have an acute  febrile illness? no    Have you ever had a severe reaction to latex? no    Vaccine information given and explained to patient? yes    Are you currently pregnant? no

## 2010-08-01 NOTE — Assessment & Plan Note (Signed)
Summary: FUKAM   CC:  f/u ov  and no missed HAART per pt.  History of Present Illness: Tamara Lucas is doing well.She has viral load of 455 but CD4 is 800. Reinforced adherence to ARVs and its importance. SHe will need TSH on next visit. She helps her daughter who is raising three children as single mother and young grandson has liver-pancreas transplant and is doing fairly well. Tamara Lucas is a quiet and unassuming person of great strength and dignity.  Preventive Screening-Counseling & Management  Alcohol-Tobacco     Alcohol drinks/day: 0     Smoking Status: never  Caffeine-Diet-Exercise     Caffeine use/day: 0  Hep-HIV-STD-Contraception     STD Risk Counseling: to avoid increased STD risk  Safety-Violence-Falls     Seat Belt Use: yes   Current Allergies: ! SEPTRA Vital Signs:  Patient profile:   55 year old female Menstrual status:  hysterectomy Height:      61 inches Weight:      146. pounds BMI:     27.69 Temp:     98.3 degrees F oral Pulse rate:   51 / minute BP sitting:   131 / 85  Vitals Entered By: Tomasita Morrow RN (Nov 22, 2009 9:41 AM) CC: f/u ov , no missed HAART per pt Is Patient Diabetic? No Pain Assessment Patient in pain? no      Nutritional Status BMI of 25 - 29 = overweight Nutritional Status Detail normal   Have you ever been in a relationship where you felt threatened, hurt or afraid?No  Domestic Violence Intervention none  Does patient need assistance? Functional Status Self care Ambulation Normal   Physical Exam  General:  alert, well-developed, and well-nourished.   Eyes:  pupils equal, pupils round, and pupils reactive to light.   Mouth:  pharynx pink and moist and poor dentition.   Neck:  supple and no masses.   Lungs:  normal respiratory effort, no intercostal retractions, and normal breath sounds.   Heart:  normal rate, regular rhythm, and no murmur.   Skin:  Intact without suspicious lesions or rashes Cervical Nodes:  No  lymphadenopathy noted Inguinal Nodes:  R inguinal LN matted.     Impression & Recommendations:  Problem # 1:  HIV DISEASE (ICD-042) Assessment Unchanged  Orders: T-CBC w/Diff ([id number]) T-Comprehensive Metabolic Panel ([id number]) T-HIV Viral Load ([id number]) T-CD4SP ([facility]) (CD4SP) Est. Patient Level IV (57846) Will continue Atripla and doing well.  Other Orders: T-TSH [phone])  Patient Instructions: 1)  Please schedule a follow-up appointment in 4-5 months. 2)  Be sure to return for lab work one (1) week before your next appointment as scheduled. Prescriptions: ENALAPRIL-HYDROCHLOROTHIAZIDE 10-25 MG TABS (ENALAPRIL-HYDROCHLOROTHIAZIDE) Take 1 tablet by mouth once a day  #30 Tablet x 6   Entered and Authorized by:   Lina Sayre MD   Signed by:   Lina Sayre MD on 11/22/2009   Method used:   Print then Give to Patient   RxID:   [id number] SYNTHROID 125 MCG TABS (LEVOTHYROXINE SODIUM) Take 1 tablet by mouth once a day  #30 Tablet x 2   Entered and Authorized by:   Lina Sayre MD   Signed by:   Lina Sayre MD on 11/22/2009   Method used:   Print then Give to Patient   RxID:   [id number] ATRIPLA 600-200-300 MG TABS (EFAVIRENZ-EMTRICITAB-TENOFOVIR) Take 1 tablet by mouth once a day at night.  #30 x 11   Entered and Authorized by:  Lina Sayre MD   Signed by:   Lina Sayre MD on 11/22/2009   Method used:   Print then Give to Patient   RxID:   [id number]

## 2010-09-11 ENCOUNTER — Encounter: Payer: Self-pay | Admitting: Licensed Clinical Social Worker

## 2010-09-11 LAB — T-HELPER CELL (CD4) - (RCID CLINIC ONLY)
CD4 % Helper T Cell: 37 % (ref 33–55)
CD4 T Cell Abs: 1180 uL (ref 400–2700)

## 2010-09-12 ENCOUNTER — Other Ambulatory Visit: Payer: Self-pay | Admitting: Infectious Diseases

## 2010-09-12 ENCOUNTER — Other Ambulatory Visit (INDEPENDENT_AMBULATORY_CARE_PROVIDER_SITE_OTHER): Payer: BC Managed Care – PPO

## 2010-09-12 DIAGNOSIS — B20 Human immunodeficiency virus [HIV] disease: Secondary | ICD-10-CM

## 2010-09-13 LAB — T-HELPER CELL (CD4) - (RCID CLINIC ONLY)
CD4 % Helper T Cell: 32 % — ABNORMAL LOW (ref 33–55)
CD4 % Helper T Cell: 40 % (ref 33–55)
CD4 T Cell Abs: 880 uL (ref 400–2700)
CD4 T Cell Abs: 980 uL (ref 400–2700)

## 2010-09-20 LAB — T-HELPER CELL (CD4) - (RCID CLINIC ONLY)
CD4 % Helper T Cell: 38 % (ref 33–55)
CD4 T Cell Abs: 800 uL (ref 400–2700)

## 2010-09-26 ENCOUNTER — Encounter: Payer: Self-pay | Admitting: Infectious Diseases

## 2010-09-26 ENCOUNTER — Ambulatory Visit (INDEPENDENT_AMBULATORY_CARE_PROVIDER_SITE_OTHER): Payer: BC Managed Care – PPO | Admitting: Infectious Diseases

## 2010-09-26 VITALS — BP 93/73 | HR 57 | Temp 98.1°F | Ht 60.0 in | Wt 139.0 lb

## 2010-09-26 DIAGNOSIS — Z0000 Encounter for general adult medical examination without abnormal findings: Secondary | ICD-10-CM

## 2010-09-26 DIAGNOSIS — B20 Human immunodeficiency virus [HIV] disease: Secondary | ICD-10-CM

## 2010-09-26 NOTE — Assessment & Plan Note (Signed)
HIV, hypertension and hypothyroidism are under appropriate control. Will followup in 6 months and encourraged Tamara Lucas to continue on her healthy eating program and exercise. She is planning on getting her teeth repaired.

## 2010-09-26 NOTE — Progress Notes (Signed)
  Subjective:    Patient ID: Tamara Lucas, female    DOB: 21-Jan-1956, 55 y.o.   MRN: [id number] w HPIGloria continues to do very well with superb control of her HIV infection and control of her BP. TSH is  WNL. She will need colonoscopy and will set up.  Review of Systems  Constitutional: Positive for activity change and appetite change. Negative for fever and fatigue.  Eyes: Negative.   Respiratory: Negative.   Cardiovascular: Negative.   Gastrointestinal: Negative.   Musculoskeletal: Negative for back pain and arthralgias.  Neurological: Negative for dizziness.  Psychiatric/Behavioral: Negative.        Objective:   Physical Exam  Constitutional: She is oriented to person, place, and time. She appears well-developed and well-nourished.  HENT:  Mouth/Throat: No oropharyngeal exudate.  Eyes: Conjunctivae and EOM are normal. Pupils are equal, round, and reactive to light.  Neck: Neck supple. No JVD present. No tracheal deviation present. No thyromegaly present.  Cardiovascular: Normal rate, regular rhythm, normal heart sounds and intact distal pulses.   Pulmonary/Chest: No respiratory distress. She has no wheezes. She has no rales.  Abdominal: She exhibits no distension and no mass. There is no tenderness.  Lymphadenopathy:    She has no cervical adenopathy.  Neurological: She is alert and oriented to person, place, and time.  Skin: Skin is warm. No rash noted.  Psychiatric: She has a normal mood and affect. Her behavior is normal.          Assessment & Plan:

## 2010-10-04 LAB — T-HELPER CELL (CD4) - (RCID CLINIC ONLY)
CD4 % Helper T Cell: 42 % (ref 33–55)
CD4 T Cell Abs: 1120 uL (ref 400–2700)

## 2010-10-08 LAB — T-HELPER CELL (CD4) - (RCID CLINIC ONLY)
CD4 % Helper T Cell: 42 % (ref 33–55)
CD4 T Cell Abs: 1110 uL (ref 400–2700)

## 2010-10-28 ENCOUNTER — Other Ambulatory Visit: Payer: Self-pay | Admitting: *Deleted

## 2010-10-28 DIAGNOSIS — I10 Essential (primary) hypertension: Secondary | ICD-10-CM

## 2010-10-28 DIAGNOSIS — B20 Human immunodeficiency virus [HIV] disease: Secondary | ICD-10-CM

## 2010-10-28 DIAGNOSIS — E039 Hypothyroidism, unspecified: Secondary | ICD-10-CM

## 2010-10-28 MED ORDER — LEVOTHYROXINE SODIUM 125 MCG PO TABS: 125.0000 ug | ORAL_TABLET | Freq: Every day | ORAL | Status: DC

## 2010-10-28 MED ORDER — EFAVIRENZ-EMTRICITAB-TENOFOVIR 600-200-300 MG PO TABS: 1.0000 | ORAL_TABLET | Freq: Every day | ORAL | Status: DC

## 2010-10-28 MED ORDER — ENALAPRIL-HYDROCHLOROTHIAZIDE 10-25 MG PO TABS: 1.0000 | ORAL_TABLET | Freq: Every day | ORAL | Status: DC

## 2010-10-28 NOTE — Telephone Encounter (Signed)
refills  

## 2010-11-07 ENCOUNTER — Other Ambulatory Visit: Payer: Self-pay | Admitting: *Deleted

## 2010-11-07 DIAGNOSIS — B20 Human immunodeficiency virus [HIV] disease: Secondary | ICD-10-CM

## 2010-11-07 MED ORDER — EFAVIRENZ-EMTRICITAB-TENOFOVIR 600-200-300 MG PO TABS: 1.0000 | ORAL_TABLET | Freq: Every day | ORAL | Status: DC

## 2011-02-05 ENCOUNTER — Other Ambulatory Visit: Payer: BC Managed Care – PPO

## 2011-02-05 ENCOUNTER — Other Ambulatory Visit: Payer: Self-pay | Admitting: Infectious Diseases

## 2011-02-05 DIAGNOSIS — B20 Human immunodeficiency virus [HIV] disease: Secondary | ICD-10-CM

## 2011-02-06 LAB — COMPREHENSIVE METABOLIC PANEL
ALT: 20 U/L (ref 0–35)
AST: 23 U/L (ref 0–37)
Albumin: 4.3 g/dL (ref 3.5–5.2)
Alkaline Phosphatase: 132 U/L — ABNORMAL HIGH (ref 39–117)
BUN: 17 mg/dL (ref 6–23)
CO2: 29 mEq/L (ref 19–32)
Calcium: 9.2 mg/dL (ref 8.4–10.5)
Chloride: 102 mEq/L (ref 96–112)
Creat: 0.74 mg/dL (ref 0.50–1.10)
Glucose, Bld: 104 mg/dL — ABNORMAL HIGH (ref 70–99)
Potassium: 4 mEq/L (ref 3.5–5.3)
Sodium: 145 mEq/L (ref 135–145)
Total Bilirubin: 0.3 mg/dL (ref 0.3–1.2)
Total Protein: 7.7 g/dL (ref 6.0–8.3)

## 2011-02-06 LAB — CBC WITH DIFFERENTIAL/PLATELET
Basophils Absolute: 0.1 10*3/uL (ref 0.0–0.1)
Basophils Relative: 1 % (ref 0–1)
Eosinophils Absolute: 0 10*3/uL (ref 0.0–0.7)
Eosinophils Relative: 1 % (ref 0–5)
HCT: 39.8 % (ref 36.0–46.0)
Hemoglobin: 12.9 g/dL (ref 12.0–15.0)
Lymphocytes Relative: 35 % (ref 12–46)
Lymphs Abs: 2.7 10*3/uL (ref 0.7–4.0)
MCH: 34 pg (ref 26.0–34.0)
MCHC: 32.4 g/dL (ref 30.0–36.0)
MCV: 105 fL — ABNORMAL HIGH (ref 78.0–100.0)
Monocytes Absolute: 0.4 10*3/uL (ref 0.1–1.0)
Monocytes Relative: 5 % (ref 3–12)
Neutro Abs: 4.6 10*3/uL (ref 1.7–7.7)
Neutrophils Relative %: 59 % (ref 43–77)
Platelets: 291 10*3/uL (ref 150–400)
RBC: 3.79 MIL/uL — ABNORMAL LOW (ref 3.87–5.11)
RDW: 12.7 % (ref 11.5–15.5)
WBC: 7.8 10*3/uL (ref 4.0–10.5)

## 2011-02-06 LAB — T-HELPER CELL (CD4) - (RCID CLINIC ONLY)
CD4 % Helper T Cell: 36 % (ref 33–55)
CD4 T Cell Abs: 1010 uL (ref 400–2700)

## 2011-02-06 LAB — TSH: TSH: 2.342 u[IU]/mL (ref 0.350–4.500)

## 2011-02-10 LAB — HIV-1 RNA QUANT-NO REFLEX-BLD
HIV 1 RNA Quant: <20 copies/mL (ref <20)
HIV-1 RNA Quant, Log: <1.3 {Log} (ref <1.30)

## 2011-02-13 ENCOUNTER — Encounter: Payer: Self-pay | Admitting: Infectious Diseases

## 2011-02-13 ENCOUNTER — Ambulatory Visit (INDEPENDENT_AMBULATORY_CARE_PROVIDER_SITE_OTHER): Payer: BC Managed Care – PPO | Admitting: Infectious Diseases

## 2011-02-13 VITALS — Temp 98.3°F | Ht 60.0 in | Wt 146.5 lb

## 2011-02-13 DIAGNOSIS — Z113 Encounter for screening for infections with a predominantly sexual mode of transmission: Secondary | ICD-10-CM

## 2011-02-13 DIAGNOSIS — Z79899 Other long term (current) drug therapy: Secondary | ICD-10-CM

## 2011-02-13 DIAGNOSIS — B20 Human immunodeficiency virus [HIV] disease: Secondary | ICD-10-CM

## 2011-02-13 DIAGNOSIS — Z21 Asymptomatic human immunodeficiency virus [HIV] infection status: Secondary | ICD-10-CM

## 2011-02-13 DIAGNOSIS — Z23 Encounter for immunization: Secondary | ICD-10-CM

## 2011-02-13 MED ORDER — EFAVIRENZ-EMTRICITAB-TENOFOVIR 600-200-300 MG PO TABS: 1.0000 | ORAL_TABLET | Freq: Every day | ORAL | Status: DC

## 2011-02-13 NOTE — Progress Notes (Signed)
  Subjective:    Patient ID: Tamara Lucas, female    DOB: 1956-01-18, 55 y.o.   MRN: [id number]  HPI Tamara Lucas is here for HIV f/u and is doing well with CD 800 and HIV <20 copies. She has no constitutional symptoms. Her thyroid function is normal and will continue 125 mcg T4 replacement daily.   Review of Systems  Constitutional: Negative.   HENT: Negative.   Eyes: Negative.   Respiratory: Negative.   Cardiovascular: Negative.   Gastrointestinal: Negative.        Objective:   Physical Exam  Normal vs. No rashes, oral thrush or adenopathy.      Assessment & Plan:  Continue current medications and will see in 4-5 months.

## 2011-03-24 LAB — T-HELPER CELL (CD4) - (RCID CLINIC ONLY)
CD4 % Helper T Cell: 35
CD4 T Cell Abs: 810

## 2011-03-27 LAB — T-HELPER CELL (CD4) - (RCID CLINIC ONLY)
CD4 % Helper T Cell: 32 — ABNORMAL LOW
CD4 T Cell Abs: 770

## 2011-04-02 LAB — T-HELPER CELL (CD4) - (RCID CLINIC ONLY)
CD4 % Helper T Cell: 34
CD4 T Cell Abs: 950

## 2011-04-08 LAB — T-HELPER CELL (CD4) - (RCID CLINIC ONLY)
CD4 % Helper T Cell: 36
CD4 T Cell Abs: 920

## 2011-04-16 LAB — T-HELPER CELL (CD4) - (RCID CLINIC ONLY)
CD4 % Helper T Cell: 28 — ABNORMAL LOW
CD4 T Cell Abs: 840

## 2011-05-26 ENCOUNTER — Other Ambulatory Visit: Payer: Self-pay | Admitting: Infectious Diseases

## 2011-05-26 DIAGNOSIS — Z1231 Encounter for screening mammogram for malignant neoplasm of breast: Secondary | ICD-10-CM

## 2011-07-03 ENCOUNTER — Other Ambulatory Visit (INDEPENDENT_AMBULATORY_CARE_PROVIDER_SITE_OTHER): Payer: BC Managed Care – PPO

## 2011-07-03 DIAGNOSIS — Z79899 Other long term (current) drug therapy: Secondary | ICD-10-CM

## 2011-07-03 DIAGNOSIS — B20 Human immunodeficiency virus [HIV] disease: Secondary | ICD-10-CM

## 2011-07-03 DIAGNOSIS — Z113 Encounter for screening for infections with a predominantly sexual mode of transmission: Secondary | ICD-10-CM

## 2011-07-03 DIAGNOSIS — Z21 Asymptomatic human immunodeficiency virus [HIV] infection status: Secondary | ICD-10-CM

## 2011-07-03 LAB — COMPREHENSIVE METABOLIC PANEL
ALT: 19 U/L (ref 0–35)
AST: 27 U/L (ref 0–37)
Albumin: 4.1 g/dL (ref 3.5–5.2)
Alkaline Phosphatase: 113 U/L (ref 39–117)
BUN: 13 mg/dL (ref 6–23)
CO2: 24 mEq/L (ref 19–32)
Calcium: 9.2 mg/dL (ref 8.4–10.5)
Chloride: 104 mEq/L (ref 96–112)
Creat: 0.6 mg/dL (ref 0.50–1.10)
Glucose, Bld: 77 mg/dL (ref 70–99)
Potassium: 3.9 mEq/L (ref 3.5–5.3)
Sodium: 142 mEq/L (ref 135–145)
Total Bilirubin: 0.3 mg/dL (ref 0.3–1.2)
Total Protein: 7.1 g/dL (ref 6.0–8.3)

## 2011-07-03 LAB — CBC WITH DIFFERENTIAL/PLATELET
Basophils Absolute: 0.1 10*3/uL (ref 0.0–0.1)
Basophils Relative: 1 % (ref 0–1)
Eosinophils Absolute: 0.1 10*3/uL (ref 0.0–0.7)
Eosinophils Relative: 1 % (ref 0–5)
HCT: 37.4 % (ref 36.0–46.0)
Hemoglobin: 12.1 g/dL (ref 12.0–15.0)
Lymphocytes Relative: 38 % (ref 12–46)
Lymphs Abs: 3.1 10*3/uL (ref 0.7–4.0)
MCH: 33.9 pg (ref 26.0–34.0)
MCHC: 32.4 g/dL (ref 30.0–36.0)
MCV: 104.8 fL — ABNORMAL HIGH (ref 78.0–100.0)
Monocytes Absolute: 0.4 10*3/uL (ref 0.1–1.0)
Monocytes Relative: 6 % (ref 3–12)
Neutro Abs: 4.5 10*3/uL (ref 1.7–7.7)
Neutrophils Relative %: 55 % (ref 43–77)
Platelets: 311 10*3/uL (ref 150–400)
RBC: 3.57 MIL/uL — ABNORMAL LOW (ref 3.87–5.11)
RDW: 13.5 % (ref 11.5–15.5)
WBC: 8.1 10*3/uL (ref 4.0–10.5)

## 2011-07-03 LAB — LIPID PANEL
Cholesterol: 213 mg/dL — ABNORMAL HIGH (ref 0–200)
HDL: 93 mg/dL (ref >39)
LDL Cholesterol: 105 mg/dL — ABNORMAL HIGH (ref 0–99)
Total CHOL/HDL Ratio: 2.3 Ratio
Triglycerides: 76 mg/dL (ref <150)
VLDL: 15 mg/dL (ref 0–40)

## 2011-07-03 LAB — RPR

## 2011-07-04 LAB — T-HELPER CELL (CD4) - (RCID CLINIC ONLY)
CD4 % Helper T Cell: 43 % (ref 33–55)
CD4 T Cell Abs: 1170 uL (ref 400–2700)

## 2011-07-07 LAB — HIV-1 RNA QUANT-NO REFLEX-BLD
HIV 1 RNA Quant: <20 copies/mL (ref <20)
HIV-1 RNA Quant, Log: <1.3 {Log} (ref <1.30)

## 2011-07-08 ENCOUNTER — Ambulatory Visit (HOSPITAL_COMMUNITY)
Admission: RE | Admit: 2011-07-08 | Discharge: 2011-07-08 | Disposition: A | Discharge status: Home / Self Care | Payer: BC Managed Care – PPO | Source: Ambulatory Visit | Attending: Infectious Diseases | Admitting: Infectious Diseases

## 2011-07-08 DIAGNOSIS — Z1231 Encounter for screening mammogram for malignant neoplasm of breast: Secondary | ICD-10-CM

## 2011-07-17 ENCOUNTER — Ambulatory Visit (INDEPENDENT_AMBULATORY_CARE_PROVIDER_SITE_OTHER): Payer: BC Managed Care – PPO | Admitting: Infectious Diseases

## 2011-07-17 ENCOUNTER — Encounter: Payer: Self-pay | Admitting: Infectious Diseases

## 2011-07-17 VITALS — BP 123/77 | HR 72 | Temp 98.1°F | Wt 153.0 lb

## 2011-07-17 DIAGNOSIS — B20 Human immunodeficiency virus [HIV] disease: Secondary | ICD-10-CM

## 2011-07-17 DIAGNOSIS — E039 Hypothyroidism, unspecified: Secondary | ICD-10-CM

## 2011-07-17 DIAGNOSIS — Z21 Asymptomatic human immunodeficiency virus [HIV] infection status: Secondary | ICD-10-CM

## 2011-07-17 DIAGNOSIS — I10 Essential (primary) hypertension: Secondary | ICD-10-CM

## 2011-07-17 MED ORDER — ENALAPRIL-HYDROCHLOROTHIAZIDE 10-25 MG PO TABS: 1.0000 | ORAL_TABLET | Freq: Every day | ORAL | Status: DC

## 2011-07-17 MED ORDER — LEVOTHYROXINE SODIUM 125 MCG PO TABS: 125.0000 ug | ORAL_TABLET | Freq: Every day | ORAL | Status: DC

## 2011-07-17 MED ORDER — EFAVIRENZ-EMTRICITAB-TENOFOVIR 600-200-300 MG PO TABS: 1.0000 | ORAL_TABLET | Freq: Every day | ORAL | Status: DC

## 2011-07-17 NOTE — Progress Notes (Signed)
Patient ID: Tamara Lucas, female   DOB: November 30, 1955, 56 y.o.   MRN: [id number] Tamara Lucas is well and has no weight loss, cough fever or rashes. She is adherent to her antivrals and her HBP medications and is controllled.   BP 123/77  Pulse 72  Temp(Src) 98.1 F (36.7 C) (Oral)  Wt 153 lb (69.4 kg) No adenopathy, rashes, or local tender ness. No thrush.   Impression: Controlled and suppressed HIV infection.    Next visit will check thyroid status.RTC 5 months. Lina Sayre

## 2011-07-24 ENCOUNTER — Telehealth: Payer: Self-pay | Admitting: *Deleted

## 2011-07-24 NOTE — Telephone Encounter (Signed)
I spoke with Dr. Maurice March who said she has had this med before & he does want her to have this. [facility] & spoke with Revonda Standard who took the message to fill the rx

## 2011-07-24 NOTE — Telephone Encounter (Signed)
Tom, a pharmacist with [facility] called [phone] to report that the pt has a sulfa allergy & the new med rx'd may also cause an allergic reaction. She had a rash with the Bactrim. He wants a call verifying that he can fill this script. Her reference number is [id number]. I paged the md

## 2011-10-03 ENCOUNTER — Other Ambulatory Visit: Payer: Self-pay | Admitting: Infectious Diseases

## 2011-10-03 DIAGNOSIS — I10 Essential (primary) hypertension: Secondary | ICD-10-CM

## 2011-10-03 DIAGNOSIS — E039 Hypothyroidism, unspecified: Secondary | ICD-10-CM

## 2011-10-06 ENCOUNTER — Other Ambulatory Visit: Payer: Self-pay | Admitting: Infectious Diseases

## 2011-10-06 DIAGNOSIS — Z113 Encounter for screening for infections with a predominantly sexual mode of transmission: Secondary | ICD-10-CM

## 2011-11-06 ENCOUNTER — Other Ambulatory Visit: Payer: BC Managed Care – PPO

## 2011-11-06 DIAGNOSIS — Z113 Encounter for screening for infections with a predominantly sexual mode of transmission: Secondary | ICD-10-CM

## 2011-11-06 DIAGNOSIS — B20 Human immunodeficiency virus [HIV] disease: Secondary | ICD-10-CM

## 2011-11-06 LAB — CBC WITH DIFFERENTIAL/PLATELET
Basophils Absolute: 0 10*3/uL (ref 0.0–0.1)
Basophils Relative: 0 % (ref 0–1)
Eosinophils Absolute: 0.1 10*3/uL (ref 0.0–0.7)
Eosinophils Relative: 1 % (ref 0–5)
HCT: 39.2 % (ref 36.0–46.0)
Hemoglobin: 12.7 g/dL (ref 12.0–15.0)
Lymphocytes Relative: 42 % (ref 12–46)
Lymphs Abs: 3.4 10*3/uL (ref 0.7–4.0)
MCH: 34 pg (ref 26.0–34.0)
MCHC: 32.4 g/dL (ref 30.0–36.0)
MCV: 105.1 fL — ABNORMAL HIGH (ref 78.0–100.0)
Monocytes Absolute: 0.5 10*3/uL (ref 0.1–1.0)
Monocytes Relative: 6 % (ref 3–12)
Neutro Abs: 4.2 10*3/uL (ref 1.7–7.7)
Neutrophils Relative %: 52 % (ref 43–77)
Platelets: 298 10*3/uL (ref 150–400)
RBC: 3.73 MIL/uL — ABNORMAL LOW (ref 3.87–5.11)
RDW: 13.3 % (ref 11.5–15.5)
WBC: 8.2 10*3/uL (ref 4.0–10.5)

## 2011-11-06 LAB — COMPREHENSIVE METABOLIC PANEL
ALT: 20 U/L (ref 0–35)
AST: 27 U/L (ref 0–37)
Albumin: 4.2 g/dL (ref 3.5–5.2)
Alkaline Phosphatase: 147 U/L — ABNORMAL HIGH (ref 39–117)
BUN: 14 mg/dL (ref 6–23)
CO2: 26 mEq/L (ref 19–32)
Calcium: 9.5 mg/dL (ref 8.4–10.5)
Chloride: 101 mEq/L (ref 96–112)
Creat: 0.66 mg/dL (ref 0.50–1.10)
Glucose, Bld: 87 mg/dL (ref 70–99)
Potassium: 3.7 mEq/L (ref 3.5–5.3)
Sodium: 137 mEq/L (ref 135–145)
Total Bilirubin: 0.3 mg/dL (ref 0.3–1.2)
Total Protein: 7.6 g/dL (ref 6.0–8.3)

## 2011-11-07 LAB — HIV-1 RNA QUANT-NO REFLEX-BLD
HIV 1 RNA Quant: 22 copies/mL — ABNORMAL HIGH (ref <20)
HIV-1 RNA Quant, Log: 1.34 {Log} — ABNORMAL HIGH (ref <1.30)

## 2011-11-07 LAB — T-HELPER CELL (CD4) - (RCID CLINIC ONLY)
CD4 % Helper T Cell: 37 % (ref 33–55)
CD4 T Cell Abs: 1140 uL (ref 400–2700)

## 2011-11-20 ENCOUNTER — Ambulatory Visit: Payer: BC Managed Care – PPO | Admitting: Infectious Diseases

## 2011-11-27 ENCOUNTER — Other Ambulatory Visit: Payer: BC Managed Care – PPO

## 2011-12-12 ENCOUNTER — Encounter: Payer: Self-pay | Admitting: Infectious Diseases

## 2011-12-12 ENCOUNTER — Ambulatory Visit (INDEPENDENT_AMBULATORY_CARE_PROVIDER_SITE_OTHER): Payer: BC Managed Care – PPO | Admitting: Infectious Diseases

## 2011-12-12 VITALS — BP 111/76 | HR 53 | Temp 98.1°F | Wt 150.2 lb

## 2011-12-12 DIAGNOSIS — B20 Human immunodeficiency virus [HIV] disease: Secondary | ICD-10-CM

## 2011-12-12 DIAGNOSIS — E039 Hypothyroidism, unspecified: Secondary | ICD-10-CM

## 2011-12-12 DIAGNOSIS — I10 Essential (primary) hypertension: Secondary | ICD-10-CM

## 2011-12-12 NOTE — Patient Instructions (Signed)
Please return in 4 months 2 weeks prior for labs

## 2011-12-18 NOTE — Progress Notes (Signed)
Patient ID: Tamara Lucas, female   DOB: [birth year], 56 y.o.   MRN: [id number] [name] is here for HIV f/u and has no complaints. She continues working at [employer] and is great support to her family and grandchildren. She takes all her meds and takes good care of herself.  ROS No fevers, weight loss,, swallowing difficulties or rashes. Exam:BP 111/76  Pulse 53  Temp 98.1 F (36.7 C) (Oral)  Wt 150 lb 4 oz (68.153 kg) Skin is normal. No adenopathy. Normal oral exam. Assessment and Plan:   HIV controlled and continue Atripla HTN and hypothyroidism controlled  Will f/u in 4 months [employer]

## 2012-04-01 ENCOUNTER — Other Ambulatory Visit: Payer: BC Managed Care – PPO

## 2012-04-01 ENCOUNTER — Other Ambulatory Visit: Payer: Self-pay | Admitting: Licensed Clinical Social Worker

## 2012-04-01 DIAGNOSIS — B20 Human immunodeficiency virus [HIV] disease: Secondary | ICD-10-CM

## 2012-04-01 LAB — CBC WITH DIFFERENTIAL/PLATELET
Basophils Absolute: 0 10*3/uL (ref 0.0–0.1)
Basophils Relative: 0 % (ref 0–1)
Eosinophils Absolute: 0 10*3/uL (ref 0.0–0.7)
Eosinophils Relative: 0 % (ref 0–5)
HCT: 34.4 % — ABNORMAL LOW (ref 36.0–46.0)
Hemoglobin: 11.9 g/dL — ABNORMAL LOW (ref 12.0–15.0)
Lymphocytes Relative: 28 % (ref 12–46)
Lymphs Abs: 2.7 10*3/uL (ref 0.7–4.0)
MCH: 33.8 pg (ref 26.0–34.0)
MCHC: 34.6 g/dL (ref 30.0–36.0)
MCV: 97.7 fL (ref 78.0–100.0)
Monocytes Absolute: 0.5 10*3/uL (ref 0.1–1.0)
Monocytes Relative: 5 % (ref 3–12)
Neutro Abs: 6.1 10*3/uL (ref 1.7–7.7)
Neutrophils Relative %: 67 % (ref 43–77)
Platelets: 303 10*3/uL (ref 150–400)
RBC: 3.52 MIL/uL — ABNORMAL LOW (ref 3.87–5.11)
RDW: 13.1 % (ref 11.5–15.5)
WBC: 9.3 10*3/uL (ref 4.0–10.5)

## 2012-04-02 LAB — T-HELPER CELL (CD4) - (RCID CLINIC ONLY)
CD4 % Helper T Cell: 42 % (ref 33–55)
CD4 T Cell Abs: 1130 uL (ref 400–2700)

## 2012-04-02 LAB — COMPREHENSIVE METABOLIC PANEL
ALT: 20 U/L (ref 0–35)
AST: 28 U/L (ref 0–37)
Albumin: 4.2 g/dL (ref 3.5–5.2)
Alkaline Phosphatase: 136 U/L — ABNORMAL HIGH (ref 39–117)
BUN: 13 mg/dL (ref 6–23)
CO2: 27 mEq/L (ref 19–32)
Calcium: 9.2 mg/dL (ref 8.4–10.5)
Chloride: 106 mEq/L (ref 96–112)
Creat: 0.72 mg/dL (ref 0.50–1.10)
Glucose, Bld: 97 mg/dL (ref 70–99)
Potassium: 3.2 mEq/L — ABNORMAL LOW (ref 3.5–5.3)
Sodium: 141 mEq/L (ref 135–145)
Total Bilirubin: 0.3 mg/dL (ref 0.3–1.2)
Total Protein: 7.1 g/dL (ref 6.0–8.3)

## 2012-04-02 LAB — HIV-1 RNA QUANT-NO REFLEX-BLD
HIV 1 RNA Quant: <20 copies/mL (ref <20)
HIV-1 RNA Quant, Log: <1.3 {Log} (ref <1.30)

## 2012-04-09 ENCOUNTER — Other Ambulatory Visit: Payer: Self-pay | Admitting: Infectious Diseases

## 2012-04-16 ENCOUNTER — Other Ambulatory Visit: Payer: Self-pay | Admitting: *Deleted

## 2012-04-16 ENCOUNTER — Other Ambulatory Visit: Payer: Self-pay | Admitting: Infectious Diseases

## 2012-04-16 ENCOUNTER — Encounter: Payer: Self-pay | Admitting: Infectious Diseases

## 2012-04-16 ENCOUNTER — Ambulatory Visit (INDEPENDENT_AMBULATORY_CARE_PROVIDER_SITE_OTHER): Payer: BC Managed Care – PPO | Admitting: Infectious Diseases

## 2012-04-16 VITALS — BP 111/69 | HR 67 | Temp 98.3°F | Ht 60.0 in | Wt 147.0 lb

## 2012-04-16 DIAGNOSIS — I10 Essential (primary) hypertension: Secondary | ICD-10-CM

## 2012-04-16 DIAGNOSIS — E079 Disorder of thyroid, unspecified: Secondary | ICD-10-CM

## 2012-04-16 DIAGNOSIS — B20 Human immunodeficiency virus [HIV] disease: Secondary | ICD-10-CM

## 2012-04-16 LAB — TSH: TSH: 1.021 u[IU]/mL (ref 0.350–4.500)

## 2012-04-16 MED ORDER — EFAVIRENZ-EMTRICITAB-TENOFOVIR 600-200-300 MG PO TABS: 1.0000 | ORAL_TABLET | Freq: Every day | ORAL | Status: DC

## 2012-04-16 MED ORDER — ENALAPRIL-HYDROCHLOROTHIAZIDE 10-25 MG PO TABS: 1.0000 | ORAL_TABLET | Freq: Every day | ORAL | Status: DC

## 2012-04-16 MED ORDER — LEVOTHYROXINE SODIUM 125 MCG PO TABS: 125.0000 ug | ORAL_TABLET | Freq: Every day | ORAL | Status: DC

## 2012-04-16 NOTE — Progress Notes (Signed)
Patient ID: Tamara Lucas, female   DOB: April 21, 1956, 56 y.o.   MRN: [id number] HIV f/u   Tamara Lucas has felt entirely well and has no fevers or weight loss. She is adherent to her Atripla and has CD4 >900 and undetectable HIV. Her BP is controlled and her last TSH a year ago was normal. She has no questions today but when offered dental refer within the [facility] she accepted since teeth are in poor repair. BP 111/69  Pulse 67  Temp 98.3 F (36.8 C) (Oral)  Ht 5' (1.524 m)  Wt 147 lb (66.679 kg)  BMI 28.71 kg/m2 No adenopathy. HEENT normal.  A/P HIV suppressed and continue Atripla. Flu vaccine updated. HTN controlled -hctzand continue lisinopril-hctz Post RX of graves diasease. Will get TSH to assess current thyroid replacement. Will see in 4-5 months for f/u Tamara Lucas

## 2012-07-06 ENCOUNTER — Other Ambulatory Visit: Payer: Self-pay | Admitting: Infectious Diseases

## 2012-07-06 DIAGNOSIS — Z1231 Encounter for screening mammogram for malignant neoplasm of breast: Secondary | ICD-10-CM

## 2012-07-06 DIAGNOSIS — Z0000 Encounter for general adult medical examination without abnormal findings: Secondary | ICD-10-CM

## 2012-07-15 ENCOUNTER — Ambulatory Visit (HOSPITAL_COMMUNITY)
Admission: RE | Admit: 2012-07-15 | Discharge: 2012-07-15 | Disposition: A | Discharge status: Home / Self Care | Payer: BC Managed Care – PPO | Source: Ambulatory Visit | Attending: Infectious Diseases | Admitting: Infectious Diseases

## 2012-07-15 DIAGNOSIS — Z1231 Encounter for screening mammogram for malignant neoplasm of breast: Secondary | ICD-10-CM

## 2012-07-15 DIAGNOSIS — Z0000 Encounter for general adult medical examination without abnormal findings: Secondary | ICD-10-CM

## 2012-10-01 ENCOUNTER — Other Ambulatory Visit: Payer: BC Managed Care – PPO

## 2012-10-01 DIAGNOSIS — B20 Human immunodeficiency virus [HIV] disease: Secondary | ICD-10-CM

## 2012-10-01 LAB — COMPREHENSIVE METABOLIC PANEL
ALT: 14 U/L (ref 0–35)
AST: 25 U/L (ref 0–37)
Albumin: 4.2 g/dL (ref 3.5–5.2)
Alkaline Phosphatase: 138 U/L — ABNORMAL HIGH (ref 39–117)
BUN: 18 mg/dL (ref 6–23)
CO2: 27 mEq/L (ref 19–32)
Calcium: 8.9 mg/dL (ref 8.4–10.5)
Chloride: 105 mEq/L (ref 96–112)
Creat: 0.68 mg/dL (ref 0.50–1.10)
Glucose, Bld: 102 mg/dL — ABNORMAL HIGH (ref 70–99)
Potassium: 3.4 mEq/L — ABNORMAL LOW (ref 3.5–5.3)
Sodium: 140 mEq/L (ref 135–145)
Total Bilirubin: 0.2 mg/dL — ABNORMAL LOW (ref 0.3–1.2)
Total Protein: 7.4 g/dL (ref 6.0–8.3)

## 2012-10-01 LAB — CBC WITH DIFFERENTIAL/PLATELET
Basophils Absolute: 0.1 10*3/uL (ref 0.0–0.1)
Basophils Relative: 1 % (ref 0–1)
Eosinophils Absolute: 0.1 10*3/uL (ref 0.0–0.7)
Eosinophils Relative: 1 % (ref 0–5)
HCT: 36.9 % (ref 36.0–46.0)
Hemoglobin: 12.8 g/dL (ref 12.0–15.0)
Lymphocytes Relative: 38 % (ref 12–46)
Lymphs Abs: 2.7 10*3/uL (ref 0.7–4.0)
MCH: 34.6 pg — ABNORMAL HIGH (ref 26.0–34.0)
MCHC: 34.7 g/dL (ref 30.0–36.0)
MCV: 99.7 fL (ref 78.0–100.0)
Monocytes Absolute: 0.4 10*3/uL (ref 0.1–1.0)
Monocytes Relative: 5 % (ref 3–12)
Neutro Abs: 3.8 10*3/uL (ref 1.7–7.7)
Neutrophils Relative %: 55 % (ref 43–77)
Platelets: 282 10*3/uL (ref 150–400)
RBC: 3.7 MIL/uL — ABNORMAL LOW (ref 3.87–5.11)
RDW: 12.8 % (ref 11.5–15.5)
WBC: 6.9 10*3/uL (ref 4.0–10.5)

## 2012-10-01 LAB — T-HELPER CELL (CD4) - (RCID CLINIC ONLY)
CD4 % Helper T Cell: 33 % (ref 33–55)
CD4 T Cell Abs: 870 uL (ref 400–2700)

## 2012-10-03 LAB — HIV-1 RNA QUANT-NO REFLEX-BLD
HIV 1 RNA Quant: <20 copies/mL (ref <20)
HIV-1 RNA Quant, Log: <1.3 {Log} (ref <1.30)

## 2012-10-15 ENCOUNTER — Ambulatory Visit: Payer: BC Managed Care – PPO | Admitting: Infectious Diseases

## 2012-11-12 ENCOUNTER — Ambulatory Visit (INDEPENDENT_AMBULATORY_CARE_PROVIDER_SITE_OTHER): Payer: BC Managed Care – PPO | Admitting: Infectious Diseases

## 2012-11-12 ENCOUNTER — Encounter: Payer: Self-pay | Admitting: Infectious Diseases

## 2012-11-12 ENCOUNTER — Other Ambulatory Visit: Payer: Self-pay | Admitting: Infectious Diseases

## 2012-11-12 VITALS — BP 120/73 | HR 65 | Temp 98.5°F | Wt 148.0 lb

## 2012-11-12 DIAGNOSIS — E039 Hypothyroidism, unspecified: Secondary | ICD-10-CM

## 2012-11-12 NOTE — Patient Instructions (Signed)
Keep up the good work and as we discussed please follow up with Dr Drue Second.

## 2012-11-12 NOTE — Progress Notes (Signed)
Tamara Lucas has been a patient of mine for nearly 20 years and was found to be HIV infected when she was informed of a former boyfriend's diagnosis. She has been a very good patient with adherence to her ARV regiment that has been Atripla for the past decade. She has mostly been suppressed with only occasional HIV blip and CD4 count has consistently been in 800 to 1000+ range. Her only other problems have been controlled HTN and Grave's disease with goiter successfully treated with RAI a decades ago and she is euthyroid on replacement of levothyroxine 125 mcg daily. 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 The youngest grandchild has significant congential gut abornormalitis and has a successful small bowel and pancreas transplant. He is now 15 and developing normally. She is very proud of his survival and accomplishments.   She recently had extensively carious teeth removed and when healed will soon have full mouth false teeth. She initially wouldn't consider this but our terrific nursing staff, Juanell Fairly counselors, and our dental services came together to help her. What a testimony to the fine and comprehensive care of the [facility]. She has absolutely no complaints this visit. Exam   BP 120/73  Pulse 65  Temp(Src) 98.5 F (36.9 C) (Oral)  Wt 148 lb (67.132 kg)  BMI 28.9 kg/m2 NAD. No adenopathy. Slightly enlarged thyroid from previous Grave's. Chest clear to auscultation. Normal heart sounds.  I&P HIV   Suppressed and contiue Atripla S/p Graves with RIA ablation contiue levothyroxine 125 mcg daily replacement HTN controlled She will see Dr. Drue Second in subsequent f/u. Lina Sayre

## 2013-03-17 ENCOUNTER — Other Ambulatory Visit: Payer: BC Managed Care – PPO

## 2013-03-17 ENCOUNTER — Ambulatory Visit (INDEPENDENT_AMBULATORY_CARE_PROVIDER_SITE_OTHER): Payer: BC Managed Care – PPO | Admitting: *Deleted

## 2013-03-17 DIAGNOSIS — Z113 Encounter for screening for infections with a predominantly sexual mode of transmission: Secondary | ICD-10-CM

## 2013-03-17 DIAGNOSIS — B20 Human immunodeficiency virus [HIV] disease: Secondary | ICD-10-CM

## 2013-03-17 DIAGNOSIS — Z23 Encounter for immunization: Secondary | ICD-10-CM

## 2013-03-17 DIAGNOSIS — Z79899 Other long term (current) drug therapy: Secondary | ICD-10-CM

## 2013-03-17 LAB — CBC WITH DIFFERENTIAL/PLATELET
Basophils Absolute: 0 10*3/uL (ref 0.0–0.1)
Basophils Relative: 0 % (ref 0–1)
Eosinophils Absolute: 0.1 10*3/uL (ref 0.0–0.7)
Eosinophils Relative: 1 % (ref 0–5)
HCT: 37.6 % (ref 36.0–46.0)
Hemoglobin: 13.2 g/dL (ref 12.0–15.0)
Lymphocytes Relative: 30 % (ref 12–46)
Lymphs Abs: 2.6 10*3/uL (ref 0.7–4.0)
MCH: 35.1 pg — ABNORMAL HIGH (ref 26.0–34.0)
MCHC: 35.1 g/dL (ref 30.0–36.0)
MCV: 100 fL (ref 78.0–100.0)
Monocytes Absolute: 0.5 10*3/uL (ref 0.1–1.0)
Monocytes Relative: 6 % (ref 3–12)
Neutro Abs: 5.2 10*3/uL (ref 1.7–7.7)
Neutrophils Relative %: 63 % (ref 43–77)
Platelets: 260 10*3/uL (ref 150–400)
RBC: 3.76 MIL/uL — ABNORMAL LOW (ref 3.87–5.11)
RDW: 13.3 % (ref 11.5–15.5)
WBC: 8.4 10*3/uL (ref 4.0–10.5)

## 2013-03-17 LAB — COMPREHENSIVE METABOLIC PANEL
ALT: 16 U/L (ref 0–35)
AST: 24 U/L (ref 0–37)
Albumin: 4.2 g/dL (ref 3.5–5.2)
Alkaline Phosphatase: 128 U/L — ABNORMAL HIGH (ref 39–117)
BUN: 10 mg/dL (ref 6–23)
CO2: 26 mEq/L (ref 19–32)
Calcium: 9.3 mg/dL (ref 8.4–10.5)
Chloride: 106 mEq/L (ref 96–112)
Creat: 0.63 mg/dL (ref 0.50–1.10)
Glucose, Bld: 85 mg/dL (ref 70–99)
Potassium: 3.9 mEq/L (ref 3.5–5.3)
Sodium: 142 mEq/L (ref 135–145)
Total Bilirubin: 0.2 mg/dL — ABNORMAL LOW (ref 0.3–1.2)
Total Protein: 7.4 g/dL (ref 6.0–8.3)

## 2013-03-18 LAB — T-HELPER CELL (CD4) - (RCID CLINIC ONLY)
CD4 % Helper T Cell: 37 % (ref 33–55)
CD4 T Cell Abs: 1020 /uL (ref 400–2700)

## 2013-03-18 LAB — RPR

## 2013-03-20 LAB — HIV-1 RNA QUANT-NO REFLEX-BLD
HIV 1 RNA Quant: <20 copies/mL (ref <20)
HIV-1 RNA Quant, Log: <1.3 {Log} (ref <1.30)

## 2013-03-31 ENCOUNTER — Encounter: Payer: Self-pay | Admitting: Internal Medicine

## 2013-03-31 ENCOUNTER — Ambulatory Visit (INDEPENDENT_AMBULATORY_CARE_PROVIDER_SITE_OTHER): Payer: BC Managed Care – PPO | Admitting: Internal Medicine

## 2013-03-31 VITALS — BP 127/76 | HR 66 | Temp 98.2°F | Wt 154.0 lb

## 2013-03-31 DIAGNOSIS — E079 Disorder of thyroid, unspecified: Secondary | ICD-10-CM

## 2013-03-31 DIAGNOSIS — Z21 Asymptomatic human immunodeficiency virus [HIV] infection status: Secondary | ICD-10-CM

## 2013-03-31 DIAGNOSIS — B20 Human immunodeficiency virus [HIV] disease: Secondary | ICD-10-CM

## 2013-03-31 MED ORDER — EFAVIRENZ-EMTRICITAB-TENOFOVIR 600-200-300 MG PO TABS: 1.0000 | ORAL_TABLET | Freq: Every day | ORAL | Status: DC

## 2013-03-31 NOTE — Progress Notes (Signed)
[facility] CLINIC NOTE  RFV: routine Subjective:    Patient ID: Tamara Lucas, female    DOB: [birth year], 57 y.o.   MRN: [id number]  HPI  Tamara Lucas is a 57yo F with HIV, CD 4 count of 1020/VL<20, on Atripla. Previously cared by Dr. Maurice March. She is doing well. No missing doses, tolerates atripla without parasomnia. No complaints, no recent illness.  Soc hx: works full-time as Optician, dispensing [place]  Current Outpatient Prescriptions on File Prior to Visit  Medication Sig Dispense Refill  . efavirenz-emtricitabine-tenofovir (ATRIPLA) 600-200-300 MG per tablet Take 1 tablet by mouth at bedtime.  30 tablet  11  . enalapril-hydrochlorothiazide (VASERETIC) 10-25 MG per tablet take 1 tablet by mouth once daily  30 tablet  6  . levothyroxine (SYNTHROID) 125 MCG tablet Take 1 tablet (125 mcg total) by mouth daily.  30 tablet  6   No current facility-administered medications on file prior to visit.   Active Ambulatory Problems    Diagnosis Date Noted  . HIV DISEASE 08/28/2006  . HYPOTHYROIDISM 08/28/2006  . HYPERTENSION 08/28/2006   Resolved Ambulatory Problems    Diagnosis Date Noted  . No Resolved Ambulatory Problems   No Additional Past Medical History   History  Substance Use Topics  . Smoking status: Former Smoker    Types: Cigarettes  . Smokeless tobacco: Never Used  . Alcohol Use: No      Review of Systems 12 point ROS is negative,    Objective:   Physical Exam BP 127/76  Pulse 66  Temp(Src) 98.2 F (36.8 C) (Oral)  Wt 154 lb (69.854 kg)  BMI 30.08 kg/m2 Physical Exam  Constitutional:  oriented to person, place, and time.  appears well-developed and well-nourished. No distress.  HENT:  Mouth/Throat: Oropharynx is clear and moist. No oropharyngeal exudate.  Cardiovascular: Normal rate, regular rhythm and normal heart sounds. Exam reveals no gallop and no friction rub.  No murmur heard.  Pulmonary/Chest: Effort normal and breath sounds normal. No respiratory  distress.  no wheezes.  Abdominal: Soft. Bowel sounds are normal. exhibits no distension. There is no tenderness.  Lymphadenopathy:  no cervical adenopathy.  Neurological: alert and oriented to person, place, and time.  Skin: Skin is warm and dry. No rash noted. No erythema.  Psychiatric:  a normal mood and affect.  behavior is normal.      Assessment & Plan:  HIV= continue with atripla, gave refill  HTN= well controlled, wil give rx at next visit  Hypothyroid = appears stable, rx at next visit  Health maintenance = will get flu vaccine already received. Will check lipid profile and hep B s Ab, next lab draw. mammo due in jan 2015  rtc in 3 months, labs 2 wks prior

## 2013-04-22 ENCOUNTER — Other Ambulatory Visit: Payer: Self-pay | Admitting: Infectious Diseases

## 2013-05-17 ENCOUNTER — Other Ambulatory Visit: Payer: Self-pay | Admitting: Infectious Diseases

## 2013-06-20 ENCOUNTER — Other Ambulatory Visit: Payer: Self-pay | Admitting: Internal Medicine

## 2013-06-20 DIAGNOSIS — Z1231 Encounter for screening mammogram for malignant neoplasm of breast: Secondary | ICD-10-CM

## 2013-07-06 ENCOUNTER — Other Ambulatory Visit (INDEPENDENT_AMBULATORY_CARE_PROVIDER_SITE_OTHER): Payer: BC Managed Care – PPO

## 2013-07-06 DIAGNOSIS — B20 Human immunodeficiency virus [HIV] disease: Secondary | ICD-10-CM

## 2013-07-06 DIAGNOSIS — Z113 Encounter for screening for infections with a predominantly sexual mode of transmission: Secondary | ICD-10-CM

## 2013-07-06 DIAGNOSIS — Z79899 Other long term (current) drug therapy: Secondary | ICD-10-CM

## 2013-07-06 DIAGNOSIS — E039 Hypothyroidism, unspecified: Secondary | ICD-10-CM

## 2013-07-06 LAB — CBC WITH DIFFERENTIAL/PLATELET
Basophils Absolute: 0 10*3/uL (ref 0.0–0.1)
Basophils Relative: 1 % (ref 0–1)
Eosinophils Absolute: 0.1 10*3/uL (ref 0.0–0.7)
Eosinophils Relative: 1 % (ref 0–5)
HCT: 36.8 % (ref 36.0–46.0)
Hemoglobin: 13 g/dL (ref 12.0–15.0)
Lymphocytes Relative: 35 % (ref 12–46)
Lymphs Abs: 2.5 10*3/uL (ref 0.7–4.0)
MCH: 34.3 pg — ABNORMAL HIGH (ref 26.0–34.0)
MCHC: 35.3 g/dL (ref 30.0–36.0)
MCV: 97.1 fL (ref 78.0–100.0)
Monocytes Absolute: 0.4 10*3/uL (ref 0.1–1.0)
Monocytes Relative: 6 % (ref 3–12)
Neutro Abs: 4.2 10*3/uL (ref 1.7–7.7)
Neutrophils Relative %: 57 % (ref 43–77)
Platelets: 264 10*3/uL (ref 150–400)
RBC: 3.79 MIL/uL — ABNORMAL LOW (ref 3.87–5.11)
RDW: 13.2 % (ref 11.5–15.5)
WBC: 7.1 10*3/uL (ref 4.0–10.5)

## 2013-07-06 LAB — BASIC METABOLIC PANEL WITH GFR
BUN: 13 mg/dL (ref 6–23)
CO2: 26 mEq/L (ref 19–32)
Calcium: 8.8 mg/dL (ref 8.4–10.5)
Chloride: 102 mEq/L (ref 96–112)
Creat: 0.59 mg/dL (ref 0.50–1.10)
GFR, Est African American: >89 mL/min
GFR, Est Non African American: >89 mL/min
Glucose, Bld: 90 mg/dL (ref 70–99)
Potassium: 4.1 mEq/L (ref 3.5–5.3)
Sodium: 138 mEq/L (ref 135–145)

## 2013-07-06 LAB — LIPID PANEL
Cholesterol: 200 mg/dL (ref 0–200)
HDL: 91 mg/dL (ref >39)
LDL Cholesterol: 97 mg/dL (ref 0–99)
Total CHOL/HDL Ratio: 2.2 Ratio
Triglycerides: 59 mg/dL (ref <150)
VLDL: 12 mg/dL (ref 0–40)

## 2013-07-06 LAB — TSH: TSH: 0.639 u[IU]/mL (ref 0.350–4.500)

## 2013-07-07 LAB — T-HELPER CELL (CD4) - (RCID CLINIC ONLY)
CD4 % Helper T Cell: 39 % (ref 33–55)
CD4 T Cell Abs: 1010 /uL (ref 400–2700)

## 2013-07-08 LAB — HIV-1 RNA QUANT-NO REFLEX-BLD
HIV 1 RNA Quant: <20 copies/mL (ref <20)
HIV-1 RNA Quant, Log: <1.3 {Log} (ref <1.30)

## 2013-07-21 ENCOUNTER — Ambulatory Visit (INDEPENDENT_AMBULATORY_CARE_PROVIDER_SITE_OTHER): Payer: BC Managed Care – PPO | Admitting: Internal Medicine

## 2013-07-21 ENCOUNTER — Encounter: Payer: Self-pay | Admitting: Internal Medicine

## 2013-07-21 ENCOUNTER — Ambulatory Visit (HOSPITAL_COMMUNITY)
Admission: RE | Admit: 2013-07-21 | Discharge: 2013-07-21 | Disposition: A | Discharge status: Home / Self Care | Payer: BC Managed Care – PPO | Source: Ambulatory Visit | Attending: Internal Medicine | Admitting: Internal Medicine

## 2013-07-21 ENCOUNTER — Ambulatory Visit (HOSPITAL_COMMUNITY): Payer: BC Managed Care – PPO

## 2013-07-21 VITALS — BP 145/84 | HR 57 | Temp 97.7°F | Ht 60.0 in | Wt 150.0 lb

## 2013-07-21 DIAGNOSIS — B20 Human immunodeficiency virus [HIV] disease: Secondary | ICD-10-CM

## 2013-07-21 DIAGNOSIS — I10 Essential (primary) hypertension: Secondary | ICD-10-CM

## 2013-07-21 DIAGNOSIS — Z1231 Encounter for screening mammogram for malignant neoplasm of breast: Secondary | ICD-10-CM

## 2013-07-21 NOTE — Progress Notes (Signed)
  RFV: routine Subjective:    Patient ID: Tamara Lucas, female    DOB: 12/24/1955, 58 y.o.   MRN: [id number]  HPI 58 yo F  HIV, Cd 4 count 1020/VL<20, on atripla. Tolerating medication without SE. Great adherence. Doing well since last visit. Had 4 days of nasal congestion now resolved. No flu.  Current Outpatient Prescriptions on File Prior to Visit  Medication Sig Dispense Refill  . efavirenz-emtricitabine-tenofovir (ATRIPLA) 600-200-300 MG per tablet Take 1 tablet by mouth at bedtime.  30 tablet  11  . enalapril-hydrochlorothiazide (VASERETIC) 10-25 MG per tablet take 1 tablet by mouth once daily  30 tablet  6  . SYNTHROID 125 MCG tablet take 1 tablet by mouth once daily  30 tablet  6   No current facility-administered medications on file prior to visit.   Active Ambulatory Problems    Diagnosis Date Noted  . HIV DISEASE 08/28/2006  . HYPOTHYROIDISM 08/28/2006  . HYPERTENSION 08/28/2006   Resolved Ambulatory Problems    Diagnosis Date Noted  . No Resolved Ambulatory Problems   No Additional Past Medical History   History  Substance Use Topics  . Smoking status: Former Smoker    Types: Cigarettes  . Smokeless tobacco: Never Used  . Alcohol Use: No     Comment: occassional     Review of Systems 12 point ros negative    Objective:   Physical Exam BP 145/84  Pulse 57  Temp(Src) 97.7 F (36.5 C) (Oral)  Ht 5' (1.524 m)  Wt 150 lb (68.04 kg)  BMI 29.30 kg/m2 Physical Exam  Constitutional: oriented to person, place, and time.  appears well-developed and well-nourished. No distress.  HENT:  Mouth/Throat: Oropharynx is clear and moist. No oropharyngeal exudate.  Cardiovascular: Normal rate, regular rhythm and normal heart sounds. Exam reveals no gallop and no friction rub.  No murmur heard.  Pulmonary/Chest: Effort normal and breath sounds normal. No respiratory distress. no wheezes.  Lymphadenopathy:  no cervical adenopathy.  Skin: Skin is warm and dry. No rash  noted. No erythema.       Assessment & Plan:  HIv disease, well controlled= continue with atripla. Refill when needed  Health maintenance = upto date with mammo, to get today. And vaccines, lipids well controlled  htn = continue on current medication. Within goal  rtc in 6 months

## 2013-10-30 ENCOUNTER — Other Ambulatory Visit: Payer: Self-pay | Admitting: Infectious Diseases

## 2013-11-29 ENCOUNTER — Other Ambulatory Visit: Payer: Self-pay | Admitting: Licensed Clinical Social Worker

## 2013-11-29 MED ORDER — ENALAPRIL-HYDROCHLOROTHIAZIDE 10-25 MG PO TABS: ORAL_TABLET | ORAL | Status: DC

## 2014-01-04 ENCOUNTER — Other Ambulatory Visit: Payer: BC Managed Care – PPO

## 2014-01-04 DIAGNOSIS — Z113 Encounter for screening for infections with a predominantly sexual mode of transmission: Secondary | ICD-10-CM

## 2014-01-04 DIAGNOSIS — B20 Human immunodeficiency virus [HIV] disease: Secondary | ICD-10-CM

## 2014-01-04 LAB — COMPLETE METABOLIC PANEL WITH GFR
ALT: 24 U/L (ref 0–35)
AST: 37 U/L (ref 0–37)
Albumin: 4.1 g/dL (ref 3.5–5.2)
Alkaline Phosphatase: 115 U/L (ref 39–117)
BUN: 12 mg/dL (ref 6–23)
CO2: 26 mEq/L (ref 19–32)
Calcium: 8.9 mg/dL (ref 8.4–10.5)
Chloride: 105 mEq/L (ref 96–112)
Creat: 0.61 mg/dL (ref 0.50–1.10)
GFR, Est African American: >89 mL/min
GFR, Est Non African American: >89 mL/min
Glucose, Bld: 95 mg/dL (ref 70–99)
Potassium: 4.1 mEq/L (ref 3.5–5.3)
Sodium: 142 mEq/L (ref 135–145)
Total Bilirubin: 0.3 mg/dL (ref 0.2–1.2)
Total Protein: 7.3 g/dL (ref 6.0–8.3)

## 2014-01-04 LAB — CBC WITH DIFFERENTIAL/PLATELET
Basophils Absolute: 0.1 10*3/uL (ref 0.0–0.1)
Basophils Relative: 1 % (ref 0–1)
Eosinophils Absolute: 0 10*3/uL (ref 0.0–0.7)
Eosinophils Relative: 0 % (ref 0–5)
HCT: 35.7 % — ABNORMAL LOW (ref 36.0–46.0)
Hemoglobin: 12.5 g/dL (ref 12.0–15.0)
Lymphocytes Relative: 36 % (ref 12–46)
Lymphs Abs: 2.7 10*3/uL (ref 0.7–4.0)
MCH: 34.2 pg — ABNORMAL HIGH (ref 26.0–34.0)
MCHC: 35 g/dL (ref 30.0–36.0)
MCV: 97.8 fL (ref 78.0–100.0)
Monocytes Absolute: 0.4 10*3/uL (ref 0.1–1.0)
Monocytes Relative: 6 % (ref 3–12)
Neutro Abs: 4.2 10*3/uL (ref 1.7–7.7)
Neutrophils Relative %: 57 % (ref 43–77)
Platelets: 278 10*3/uL (ref 150–400)
RBC: 3.65 MIL/uL — ABNORMAL LOW (ref 3.87–5.11)
RDW: 13.4 % (ref 11.5–15.5)
WBC: 7.4 10*3/uL (ref 4.0–10.5)

## 2014-01-05 LAB — HIV-1 RNA QUANT-NO REFLEX-BLD
HIV 1 RNA Quant: 100 copies/mL — ABNORMAL HIGH (ref <20)
HIV-1 RNA Quant, Log: 2 {Log} — ABNORMAL HIGH (ref <1.30)

## 2014-01-05 LAB — T-HELPER CELL (CD4) - (RCID CLINIC ONLY)
CD4 % Helper T Cell: 33 % (ref 33–55)
CD4 T Cell Abs: 830 /uL (ref 400–2700)

## 2014-01-18 ENCOUNTER — Encounter: Payer: Self-pay | Admitting: Internal Medicine

## 2014-01-18 ENCOUNTER — Ambulatory Visit (INDEPENDENT_AMBULATORY_CARE_PROVIDER_SITE_OTHER): Payer: BC Managed Care – PPO | Admitting: Internal Medicine

## 2014-01-18 VITALS — BP 116/63 | HR 61 | Temp 97.1°F | Wt 138.0 lb

## 2014-01-18 DIAGNOSIS — Z0000 Encounter for general adult medical examination without abnormal findings: Secondary | ICD-10-CM

## 2014-01-18 DIAGNOSIS — B20 Human immunodeficiency virus [HIV] disease: Secondary | ICD-10-CM

## 2014-01-18 DIAGNOSIS — I10 Essential (primary) hypertension: Secondary | ICD-10-CM

## 2014-01-18 NOTE — Progress Notes (Signed)
   Subjective:    Patient ID: Tamara Lucas, female    DOB: 03/19/1956, 58 y.o.   MRN: [id number]  HPI 58 yo F with HIV disease, CD 4 count of 830/VL 100. On atripla. Here for 6 month visit.doing well. No difficulty with health. Has not had csy but not interested in doing so at this time.  Current Outpatient Prescriptions on File Prior to Visit  Medication Sig Dispense Refill  . acetaminophen (TYLENOL) 325 MG tablet Take 325 mg by mouth as needed.      Marland Kitchen. efavirenz-emtricitabine-tenofovir (ATRIPLA) 600-200-300 MG per tablet Take 1 tablet by mouth at bedtime.  30 tablet  11  . enalapril-hydrochlorothiazide (VASERETIC) 10-25 MG per tablet take 1 tablet by mouth once daily  30 tablet  6  . SYNTHROID 125 MCG tablet take 1 tablet by mouth once daily  30 tablet  6   No current facility-administered medications on file prior to visit.   Active Ambulatory Problems    Diagnosis Date Noted  . HIV DISEASE 08/28/2006  . HYPOTHYROIDISM 08/28/2006  . HYPERTENSION 08/28/2006   Resolved Ambulatory Problems    Diagnosis Date Noted  . No Resolved Ambulatory Problems   No Additional Past Medical History    Review of Systems 10 point ros is negative    Objective:   Physical Exam BP 116/63  Pulse 61  Temp(Src) 97.1 F (36.2 C) (Oral)  Wt 138 lb (62.596 kg)  Constitutional:  oriented to person, place, and time. appears well-developed and well-nourished. No distress.  HENT:  Mouth/Throat: Oropharynx is clear and moist. No oropharyngeal exudate.  Cardiovascular: Normal rate, regular rhythm and normal heart sounds. Exam reveals no gallop and no friction rub.  No murmur heard.  Pulmonary/Chest: Effort normal and breath sounds normal. No respiratory distress.  has no wheezes.  Abdominal: Soft. Bowel sounds are normal.  exhibits no distension. There is no tenderness.  Lymphadenopathy: no cervical adenopathy.  Neurological: alert and oriented to person, place, and time.  Skin: Skin is warm and  dry. No rash noted. No erythema.  Psychiatric: a normal mood and affect. behavior is normal.       Assessment & Plan:  HIV= viral blip noted on recent labs. Will repeat labs in 3 months to see if still persistent to decide on change of therapy.  Health maintenance = will give stool occult card at next visit. She is not interested in colonoscopy at this time.  htn = well controlled

## 2014-02-24 ENCOUNTER — Other Ambulatory Visit: Payer: Self-pay | Admitting: *Deleted

## 2014-02-24 ENCOUNTER — Telehealth: Payer: Self-pay | Admitting: Licensed Clinical Social Worker

## 2014-02-24 ENCOUNTER — Other Ambulatory Visit: Payer: Self-pay | Admitting: Licensed Clinical Social Worker

## 2014-02-24 DIAGNOSIS — B20 Human immunodeficiency virus [HIV] disease: Secondary | ICD-10-CM

## 2014-02-24 MED ORDER — EFAVIRENZ-EMTRICITAB-TENOFOVIR 600-200-300 MG PO TABS: 1.0000 | ORAL_TABLET | Freq: Every day | ORAL | Status: DC

## 2014-02-24 NOTE — Telephone Encounter (Signed)
Patient called back and I sent the RX to [facility] in [place] per patient

## 2014-02-24 NOTE — Telephone Encounter (Signed)
Left patient a message asking her to call back due to her prescription not able to be filled at [facility]. Received a fax stating it has to go to specialty pharmacy. I asked the patient to check with her insurance to see what specialty pharmacy she needs to use.

## 2014-04-05 ENCOUNTER — Ambulatory Visit (INDEPENDENT_AMBULATORY_CARE_PROVIDER_SITE_OTHER): Payer: BC Managed Care – PPO | Admitting: *Deleted

## 2014-04-05 ENCOUNTER — Other Ambulatory Visit (INDEPENDENT_AMBULATORY_CARE_PROVIDER_SITE_OTHER): Payer: BC Managed Care – PPO

## 2014-04-05 DIAGNOSIS — Z113 Encounter for screening for infections with a predominantly sexual mode of transmission: Secondary | ICD-10-CM

## 2014-04-05 DIAGNOSIS — Z23 Encounter for immunization: Secondary | ICD-10-CM | POA: Diagnosis not present

## 2014-04-05 DIAGNOSIS — B20 Human immunodeficiency virus [HIV] disease: Secondary | ICD-10-CM

## 2014-04-05 LAB — COMPLETE METABOLIC PANEL WITH GFR
ALT: 38 U/L — ABNORMAL HIGH (ref 0–35)
AST: 63 U/L — ABNORMAL HIGH (ref 0–37)
Albumin: 4 g/dL (ref 3.5–5.2)
Alkaline Phosphatase: 139 U/L — ABNORMAL HIGH (ref 39–117)
BUN: 10 mg/dL (ref 6–23)
CO2: 31 mEq/L (ref 19–32)
Calcium: 9.2 mg/dL (ref 8.4–10.5)
Chloride: 104 mEq/L (ref 96–112)
Creat: 0.65 mg/dL (ref 0.50–1.10)
GFR, Est African American: >89 mL/min
GFR, Est Non African American: >89 mL/min
Glucose, Bld: 98 mg/dL (ref 70–99)
Potassium: 3.8 mEq/L (ref 3.5–5.3)
Sodium: 142 mEq/L (ref 135–145)
Total Bilirubin: 0.5 mg/dL (ref 0.2–1.2)
Total Protein: 7.6 g/dL (ref 6.0–8.3)

## 2014-04-05 LAB — CBC WITH DIFFERENTIAL/PLATELET
Basophils Absolute: 0 10*3/uL (ref 0.0–0.1)
Basophils Relative: 0 % (ref 0–1)
Eosinophils Absolute: 0 10*3/uL (ref 0.0–0.7)
Eosinophils Relative: 0 % (ref 0–5)
HCT: 36.2 % (ref 36.0–46.0)
Hemoglobin: 12.5 g/dL (ref 12.0–15.0)
Lymphocytes Relative: 29 % (ref 12–46)
Lymphs Abs: 2.1 10*3/uL (ref 0.7–4.0)
MCH: 34.4 pg — ABNORMAL HIGH (ref 26.0–34.0)
MCHC: 34.5 g/dL (ref 30.0–36.0)
MCV: 99.7 fL (ref 78.0–100.0)
Monocytes Absolute: 0.4 10*3/uL (ref 0.1–1.0)
Monocytes Relative: 6 % (ref 3–12)
Neutro Abs: 4.7 10*3/uL (ref 1.7–7.7)
Neutrophils Relative %: 65 % (ref 43–77)
Platelets: 285 10*3/uL (ref 150–400)
RBC: 3.63 MIL/uL — ABNORMAL LOW (ref 3.87–5.11)
RDW: 13.1 % (ref 11.5–15.5)
WBC: 7.2 10*3/uL (ref 4.0–10.5)

## 2014-04-05 LAB — RPR

## 2014-04-06 LAB — T-HELPER CELL (CD4) - (RCID CLINIC ONLY)
CD4 % Helper T Cell: 39 % (ref 33–55)
CD4 T Cell Abs: 800 /uL (ref 400–2700)

## 2014-04-06 LAB — HIV-1 RNA QUANT-NO REFLEX-BLD
HIV 1 RNA Quant: <20 copies/mL (ref <20)
HIV-1 RNA Quant, Log: <1.3 {Log} (ref <1.30)

## 2014-04-19 ENCOUNTER — Ambulatory Visit: Payer: BC Managed Care – PPO | Admitting: Internal Medicine

## 2014-05-01 ENCOUNTER — Encounter: Payer: Self-pay | Admitting: Internal Medicine

## 2014-05-01 ENCOUNTER — Ambulatory Visit (INDEPENDENT_AMBULATORY_CARE_PROVIDER_SITE_OTHER): Payer: BC Managed Care – PPO | Admitting: Internal Medicine

## 2014-05-01 VITALS — BP 120/71 | HR 67 | Temp 97.1°F | Wt 136.0 lb

## 2014-05-01 DIAGNOSIS — Z21 Asymptomatic human immunodeficiency virus [HIV] infection status: Secondary | ICD-10-CM | POA: Diagnosis not present

## 2014-05-01 NOTE — Progress Notes (Signed)
Patient ID: [name], female   DOB: 10/01/1955, 58 y.o.   MRN: [id number]       Patient ID: [name], female   DOB: 03/23/1956, 58 y.o.   MRN: [id number]  HPI Tamara Lucas is a 58yo F with well controlled HIV disease, CD 4 count of 800/VL<20 (oct 2015) down from 100 in July. Had transient viral blip. She continues to take atripla without difficulty  Outpatient Encounter Prescriptions as of 05/01/2014  Medication Sig  . acetaminophen (TYLENOL) 325 MG tablet Take 325 mg by mouth as needed.  Marland Kitchen. efavirenz-emtricitabine-tenofovir (ATRIPLA) 600-200-300 MG per tablet Take 1 tablet by mouth at bedtime.  . enalapril-hydrochlorothiazide (VASERETIC) 10-25 MG per tablet take 1 tablet by mouth once daily  . SYNTHROID 125 MCG tablet take 1 tablet by mouth once daily     Patient Active Problem List   Diagnosis Date Noted  . HIV DISEASE 08/28/2006  . HYPOTHYROIDISM 08/28/2006  . HYPERTENSION 08/28/2006     Health Maintenance Due  Topic Date Due  . TETANUS/TDAP  09/29/1972  . PAP SMEAR  05/19/2010  . ZOSTAVAX  09/29/2013     Review of Systems   Constitutional: Negative for fever, chills, diaphoresis, activity change, appetite change, fatigue and unexpected weight change.  HENT: Negative for congestion, sore throat, rhinorrhea, sneezing, trouble swallowing and sinus pressure.  Eyes: Negative for photophobia and visual disturbance.  Respiratory: Negative for cough, chest tightness, shortness of breath, wheezing and stridor.  Cardiovascular: Negative for chest pain, palpitations and leg swelling.  Gastrointestinal: Negative for nausea, vomiting, abdominal pain, diarrhea, constipation, blood in stool, abdominal distention and anal bleeding.  Genitourinary: Negative for dysuria, hematuria, flank pain and difficulty urinating.  Musculoskeletal: Negative for myalgias, back pain, joint swelling, arthralgias and gait problem.  Skin: Negative for color change, pallor, rash and wound.  Neurological:  Negative for dizziness, tremors, weakness and light-headedness.  Hematological: Negative for adenopathy. Does not bruise/bleed easily.  Psychiatric/Behavioral: Negative for behavioral problems, confusion, sleep disturbance, dysphoric mood, decreased concentration and agitation.    Physical Exam   There were no vitals taken for this visit. Physical Exam  Constitutional:  oriented to person, place, and time. appears well-developed and well-nourished. No distress.  HENT:  Mouth/Throat: Oropharynx is clear and moist. No oropharyngeal exudate.  Cardiovascular: Normal rate, regular rhythm and normal heart sounds. Exam reveals no gallop and no friction rub.  No murmur heard.  Pulmonary/Chest: Effort normal and breath sounds normal. No respiratory distress.  has no wheezes.  Abdominal: Soft. Bowel sounds are normal.  exhibits no distension. There is no tenderness.  Lymphadenopathy: no cervical adenopathy.  Neurological: alert and oriented to person, place, and time.  Skin: Skin is warm and dry. No rash noted. No erythema.  Psychiatric: a normal mood and affect.  behavior is normal.   Lab Results  Component Value Date   CD4TCELL 39 04/05/2014   Lab Results  Component Value Date   CD4TABS 800 04/05/2014   CD4TABS 830 01/04/2014   CD4TABS 1010 07/06/2013   Lab Results  Component Value Date   HIV1RNAQUANT <20 04/05/2014   Lab Results  Component Value Date   HEPBSAB No 08/24/2006   No results found for: RPR  CBC Lab Results  Component Value Date   WBC 7.2 04/05/2014   RBC 3.63* 04/05/2014   HGB 12.5 04/05/2014   HCT 36.2 04/05/2014   PLT 285 04/05/2014   MCV 99.7 04/05/2014   MCH 34.4* 04/05/2014   MCHC 34.5 04/05/2014  RDW 13.1 04/05/2014   LYMPHSABS 2.1 04/05/2014   MONOABS 0.4 04/05/2014   EOSABS 0.0 04/05/2014   BASOSABS 0.0 04/05/2014   BMET Lab Results  Component Value Date   NA 142 04/05/2014   K 3.8 04/05/2014   CL 104 04/05/2014   CO2 31 04/05/2014    GLUCOSE 98 04/05/2014   BUN 10 04/05/2014   CREATININE 0.65 04/05/2014   CALCIUM 9.2 04/05/2014   GFRNONAA >89 04/05/2014   GFRAA >89 04/05/2014     Assessment and Plan  hiv = well controlled, continue with atripla  Health maintenance = received flu shot in beginning of oct; well controlled lipids, diet controlled. Check lipid profile at next visit  Women's health = will check mammo again in jan 2016  rtc in 6 month

## 2014-05-05 ENCOUNTER — Other Ambulatory Visit: Payer: Self-pay | Admitting: Internal Medicine

## 2014-05-05 DIAGNOSIS — Z1231 Encounter for screening mammogram for malignant neoplasm of breast: Secondary | ICD-10-CM

## 2014-05-12 ENCOUNTER — Other Ambulatory Visit: Payer: Self-pay | Admitting: Infectious Diseases

## 2014-06-09 ENCOUNTER — Other Ambulatory Visit: Payer: Self-pay | Admitting: *Deleted

## 2014-06-09 MED ORDER — ENALAPRIL-HYDROCHLOROTHIAZIDE 10-25 MG PO TABS: ORAL_TABLET | ORAL | Status: DC

## 2014-07-13 ENCOUNTER — Ambulatory Visit (HOSPITAL_COMMUNITY)
Admission: RE | Admit: 2014-07-13 | Discharge: 2014-07-13 | Disposition: A | Discharge status: Home / Self Care | Payer: BLUE CROSS/BLUE SHIELD | Source: Ambulatory Visit | Attending: Internal Medicine | Admitting: Internal Medicine

## 2014-07-13 DIAGNOSIS — Z1231 Encounter for screening mammogram for malignant neoplasm of breast: Secondary | ICD-10-CM

## 2014-09-26 ENCOUNTER — Other Ambulatory Visit: Payer: Self-pay | Admitting: Internal Medicine

## 2014-09-26 DIAGNOSIS — B20 Human immunodeficiency virus [HIV] disease: Secondary | ICD-10-CM

## 2014-10-03 ENCOUNTER — Other Ambulatory Visit: Payer: BLUE CROSS/BLUE SHIELD

## 2014-10-03 DIAGNOSIS — Z21 Asymptomatic human immunodeficiency virus [HIV] infection status: Secondary | ICD-10-CM

## 2014-10-03 LAB — CBC WITH DIFFERENTIAL/PLATELET
Basophils Absolute: 0 10*3/uL (ref 0.0–0.1)
Basophils Relative: 0 % (ref 0–1)
Eosinophils Absolute: 0.1 10*3/uL (ref 0.0–0.7)
Eosinophils Relative: 1 % (ref 0–5)
HCT: 38.4 % (ref 36.0–46.0)
Hemoglobin: 13.1 g/dL (ref 12.0–15.0)
Lymphocytes Relative: 43 % (ref 12–46)
Lymphs Abs: 3.2 10*3/uL (ref 0.7–4.0)
MCH: 34.7 pg — ABNORMAL HIGH (ref 26.0–34.0)
MCHC: 34.1 g/dL (ref 30.0–36.0)
MCV: 101.6 fL — ABNORMAL HIGH (ref 78.0–100.0)
MPV: 9.6 fL (ref 8.6–12.4)
Monocytes Absolute: 0.4 10*3/uL (ref 0.1–1.0)
Monocytes Relative: 6 % (ref 3–12)
Neutro Abs: 3.7 10*3/uL (ref 1.7–7.7)
Neutrophils Relative %: 50 % (ref 43–77)
Platelets: 292 10*3/uL (ref 150–400)
RBC: 3.78 MIL/uL — ABNORMAL LOW (ref 3.87–5.11)
RDW: 12.7 % (ref 11.5–15.5)
WBC: 7.4 10*3/uL (ref 4.0–10.5)

## 2014-10-03 LAB — BASIC METABOLIC PANEL
BUN: 8 mg/dL (ref 6–23)
CO2: 30 mEq/L (ref 19–32)
Calcium: 9.1 mg/dL (ref 8.4–10.5)
Chloride: 102 mEq/L (ref 96–112)
Creat: 0.62 mg/dL (ref 0.50–1.10)
Glucose, Bld: 91 mg/dL (ref 70–99)
Potassium: 4.3 mEq/L (ref 3.5–5.3)
Sodium: 144 mEq/L (ref 135–145)

## 2014-10-04 LAB — T-HELPER CELL (CD4) - (RCID CLINIC ONLY)
CD4 % Helper T Cell: 37 % (ref 33–55)
CD4 T Cell Abs: 1190 /uL (ref 400–2700)

## 2014-10-04 LAB — HIV-1 RNA QUANT-NO REFLEX-BLD
HIV 1 RNA Quant: <20 copies/mL (ref <20)
HIV-1 RNA Quant, Log: <1.3 {Log} (ref <1.30)

## 2014-10-17 ENCOUNTER — Ambulatory Visit: Payer: Self-pay | Admitting: Internal Medicine

## 2014-10-24 ENCOUNTER — Encounter: Payer: Self-pay | Admitting: Internal Medicine

## 2014-10-24 ENCOUNTER — Ambulatory Visit (INDEPENDENT_AMBULATORY_CARE_PROVIDER_SITE_OTHER): Payer: BLUE CROSS/BLUE SHIELD | Admitting: Internal Medicine

## 2014-10-24 VITALS — BP 123/75 | HR 62 | Temp 97.2°F | Wt 139.0 lb

## 2014-10-24 DIAGNOSIS — Z0000 Encounter for general adult medical examination without abnormal findings: Secondary | ICD-10-CM | POA: Diagnosis not present

## 2014-10-24 DIAGNOSIS — B20 Human immunodeficiency virus [HIV] disease: Secondary | ICD-10-CM

## 2014-10-24 DIAGNOSIS — I10 Essential (primary) hypertension: Secondary | ICD-10-CM

## 2014-10-24 LAB — LIPID PANEL
Cholesterol: 220 mg/dL — ABNORMAL HIGH (ref 0–200)
HDL: 128 mg/dL (ref >=46)
LDL Cholesterol: 82 mg/dL (ref 0–99)
Total CHOL/HDL Ratio: 1.7 Ratio
Triglycerides: 50 mg/dL (ref <150)
VLDL: 10 mg/dL (ref 0–40)

## 2014-10-24 MED ORDER — LEVOTHYROXINE SODIUM 125 MCG PO TABS: 125.0000 ug | ORAL_TABLET | Freq: Every day | ORAL | Status: DC

## 2014-10-24 MED ORDER — ELVITEG-COBIC-EMTRICIT-TENOFAF 150-150-200-10 MG PO TABS: 1.0000 | ORAL_TABLET | Freq: Every day | ORAL | Status: DC

## 2014-10-24 MED ORDER — ENALAPRIL-HYDROCHLOROTHIAZIDE 10-25 MG PO TABS: ORAL_TABLET | ORAL | Status: DC

## 2014-10-24 NOTE — Progress Notes (Signed)
Patient ID: Tamara Lucas Scantlin, female   DOB: 10/13/1955, 59 y.o.   MRN: [id number]       Patient ID: Tamara Lucas Dejoy, female   DOB: 07/21/1955, 59 y.o.   MRN: [id number]  HPI 59yo F with HIV disease. CD 4 count of 1190/VL<20 on atripla. In good state of health. Increasing exercise. No recent illnesses. Had mammo in January which was negative for malignancy. No complaints with her health.  Outpatient Encounter Prescriptions as of 10/24/2014  Medication Sig  . acetaminophen (TYLENOL) 325 MG tablet Take 325 mg by mouth as needed.  . ATRIPLA 600-200-300 MG per tablet TAKE 1 TABLET BY MOUTH AT BEDTIME.  . enalapril-hydrochlorothiazide (VASERETIC) 10-25 MG per tablet take 1 tablet by mouth once daily  . levothyroxine (SYNTHROID) 125 MCG tablet Take 1 tablet (125 mcg total) by mouth daily.  . [DISCONTINUED] enalapril-hydrochlorothiazide (VASERETIC) 10-25 MG per tablet take 1 tablet by mouth once daily  . [DISCONTINUED] SYNTHROID 125 MCG tablet take 1 tablet by mouth once daily     Patient Active Problem List   Diagnosis Date Noted  . HIV DISEASE 08/28/2006  . HYPOTHYROIDISM 08/28/2006  . HYPERTENSION 08/28/2006     Health Maintenance Due  Topic Date Due  . TETANUS/TDAP  09/29/1972  . PAP SMEAR  05/19/2010  . ZOSTAVAX  09/29/2013     Review of Systems Review of Systems  Constitutional: Negative for fever, chills, diaphoresis, activity change, appetite change, fatigue and unexpected weight change.  HENT: Negative for congestion, sore throat, rhinorrhea, sneezing, trouble swallowing and sinus pressure.  Eyes: Negative for photophobia and visual disturbance.  Respiratory: Negative for cough, chest tightness, shortness of breath, wheezing and stridor.  Cardiovascular: Negative for chest pain, palpitations and leg swelling.  Gastrointestinal: Negative for nausea, vomiting, abdominal pain, diarrhea, constipation, blood in stool, abdominal distention and anal bleeding.  Genitourinary: Negative  for dysuria, hematuria, flank pain and difficulty urinating.  Musculoskeletal: Negative for myalgias, back pain, joint swelling, arthralgias and gait problem.  Skin: Negative for color change, pallor, rash and wound.  Neurological: Negative for dizziness, tremors, weakness and light-headedness.  Hematological: Negative for adenopathy. Does not bruise/bleed easily.  Psychiatric/Behavioral: Negative for behavioral problems, confusion, sleep disturbance, dysphoric mood, decreased concentration and agitation.    Physical Exam   BP 123/75 mmHg  Pulse 62  Temp(Src) 97.2 F (36.2 C) (Oral)  Wt 139 lb (63.05 kg) Physical Exam  Constitutional:  oriented to person, place, and time. appears well-developed and well-nourished. No distress.  HENT: [place]/AT, PERRLA, no scleral icterus Mouth/Throat: Oropharynx is clear and moist. No oropharyngeal exudate.  Cardiovascular: Normal rate, regular rhythm and normal heart sounds. Exam reveals no gallop and no friction rub.  No murmur heard.  Pulmonary/Chest: Effort normal and breath sounds normal. No respiratory distress.  has no wheezes.  Neck = supple, no nuchal rigidity Lymphadenopathy: no cervical adenopathy. No axillary adenopathy Skin: Skin is warm and dry. No rash noted. No erythema.  Psychiatric: a normal mood and affect.  behavior is normal.   Lab Results  Component Value Date   CD4TCELL 37 10/03/2014   Lab Results  Component Value Date   CD4TABS 1190 10/03/2014   CD4TABS 800 04/05/2014   CD4TABS 830 01/04/2014   Lab Results  Component Value Date   HIV1RNAQUANT <20 10/03/2014   Lab Results  Component Value Date   HEPBSAB No 08/24/2006   No results found for: RPR  CBC Lab Results  Component Value Date   WBC 7.4 10/03/2014  RBC 3.78* 10/03/2014   HGB 13.1 10/03/2014   HCT 38.4 10/03/2014   PLT 292 10/03/2014   MCV 101.6* 10/03/2014   MCH 34.7* 10/03/2014   MCHC 34.1 10/03/2014   RDW 12.7 10/03/2014   LYMPHSABS 3.2 10/03/2014    MONOABS 0.4 10/03/2014   EOSABS 0.1 10/03/2014   BASOSABS 0.0 10/03/2014   BMET Lab Results  Component Value Date   NA 144 10/03/2014   K 4.3 10/03/2014   CL 102 10/03/2014   CO2 30 10/03/2014   GLUCOSE 91 10/03/2014   BUN 8 10/03/2014   CREATININE 0.62 10/03/2014   CALCIUM 9.1 10/03/2014   GFRNONAA >89 04/05/2014   GFRAA >89 04/05/2014     Assessment and Plan  hiv disease = will switch to genvoya due to sleep disturbance  htn = well controlled. Continue on current regimen  Health maintenance = will check lipids  rtc in 3 months

## 2015-01-23 ENCOUNTER — Ambulatory Visit (INDEPENDENT_AMBULATORY_CARE_PROVIDER_SITE_OTHER): Payer: BLUE CROSS/BLUE SHIELD | Admitting: Internal Medicine

## 2015-01-23 ENCOUNTER — Encounter: Payer: Self-pay | Admitting: Internal Medicine

## 2015-01-23 VITALS — BP 108/68 | HR 64 | Temp 98.5°F | Wt 143.0 lb

## 2015-01-23 DIAGNOSIS — I10 Essential (primary) hypertension: Secondary | ICD-10-CM

## 2015-01-23 DIAGNOSIS — B20 Human immunodeficiency virus [HIV] disease: Secondary | ICD-10-CM

## 2015-01-23 DIAGNOSIS — R718 Other abnormality of red blood cells: Secondary | ICD-10-CM | POA: Diagnosis not present

## 2015-01-23 LAB — CBC WITH DIFFERENTIAL/PLATELET
Basophils Absolute: 0.1 10*3/uL (ref 0.0–0.1)
Basophils Relative: 1 % (ref 0–1)
Eosinophils Absolute: 0.1 10*3/uL (ref 0.0–0.7)
Eosinophils Relative: 1 % (ref 0–5)
HCT: 36 % (ref 36.0–46.0)
Hemoglobin: 12.2 g/dL (ref 12.0–15.0)
Lymphocytes Relative: 45 % (ref 12–46)
Lymphs Abs: 2.4 10*3/uL (ref 0.7–4.0)
MCH: 33.2 pg (ref 26.0–34.0)
MCHC: 33.9 g/dL (ref 30.0–36.0)
MCV: 98.1 fL (ref 78.0–100.0)
MPV: 10.3 fL (ref 8.6–12.4)
Monocytes Absolute: 0.4 10*3/uL (ref 0.1–1.0)
Monocytes Relative: 8 % (ref 3–12)
Neutro Abs: 2.4 10*3/uL (ref 1.7–7.7)
Neutrophils Relative %: 45 % (ref 43–77)
Platelets: 239 10*3/uL (ref 150–400)
RBC: 3.67 MIL/uL — ABNORMAL LOW (ref 3.87–5.11)
RDW: 12.1 % (ref 11.5–15.5)
WBC: 5.3 10*3/uL (ref 4.0–10.5)

## 2015-01-23 LAB — COMPLETE METABOLIC PANEL WITH GFR
ALT: 6 U/L (ref 6–29)
AST: 15 U/L (ref 10–35)
Albumin: 3.8 g/dL (ref 3.6–5.1)
Alkaline Phosphatase: 67 U/L (ref 33–130)
BUN: 15 mg/dL (ref 7–25)
CO2: 25 mEq/L (ref 20–31)
Calcium: 9.5 mg/dL (ref 8.6–10.4)
Chloride: 105 mEq/L (ref 98–110)
Creat: 0.7 mg/dL (ref 0.50–0.99)
GFR, Est African American: >89 mL/min (ref >=60)
GFR, Est Non African American: >89 mL/min (ref >=60)
Glucose, Bld: 96 mg/dL (ref 65–99)
Potassium: 4.3 mEq/L (ref 3.5–5.3)
Sodium: 140 mEq/L (ref 135–146)
Total Bilirubin: 0.4 mg/dL (ref 0.2–1.2)
Total Protein: 7 g/dL (ref 6.1–8.1)

## 2015-01-23 NOTE — Progress Notes (Signed)
Patient ID: Tamara Lucas, female   DOB: Sep 04, 1955, 59 y.o.   MRN: [id number]     RFV: HIV disease  Patient ID: Tamara Lucas, female   DOB: 12-02-1955, 59 y.o.   MRN: [id number]  HPI 59yo F with HIV disease, well controlled, CD 4 count of 1190/VL<20, switched to genvoya in the spring time at our last visit. She states that she has enjoyed being on the new medicine. No longer has sleep disturbance and also enjoys not taking a medicine at bedtime. She is in good state of health. No complaints.  Outpatient Encounter Prescriptions as of 01/23/2015  Medication Sig  . acetaminophen (TYLENOL) 325 MG tablet Take 325 mg by mouth as needed.  . elvitegravir-cobicistat-emtricitabine-tenofovir (GENVOYA) 150-150-200-10 MG TABS tablet Take 1 tablet by mouth daily with breakfast.  . enalapril-hydrochlorothiazide (VASERETIC) 10-25 MG per tablet take 1 tablet by mouth once daily  . levothyroxine (SYNTHROID) 125 MCG tablet Take 1 tablet (125 mcg total) by mouth daily.   No facility-administered encounter medications on file as of 01/23/2015.     Patient Active Problem List   Diagnosis Date Noted  . HIV DISEASE 08/28/2006  . HYPOTHYROIDISM 08/28/2006  . HYPERTENSION 08/28/2006     Health Maintenance Due  Topic Date Due  . TETANUS/TDAP  09/29/1972  . PAP SMEAR  05/19/2010  . ZOSTAVAX  09/29/2013     Review of Systems No fever, chills, nightsweats. No nightmares, no insomnia. 10 point ros is negative Physical Exam   BP 108/68 mmHg  Pulse 64  Temp(Src) 98.5 F (36.9 C) (Oral)  Wt 143 lb (64.864 kg) Physical Exam  Constitutional:  oriented to person, place, and time. appears well-developed and well-nourished. No distress.  HENT: [place]/AT, PERRLA, no scleral icterus Mouth/Throat: Oropharynx is clear and moist. No oropharyngeal exudate.  Cardiovascular: Normal rate, regular rhythm and normal heart sounds. Exam reveals no gallop and no friction rub.  No murmur heard.  Pulmonary/Chest: Effort  normal and breath sounds normal. No respiratory distress.  has no wheezes.  Neck = supple, no nuchal rigidity Lymphadenopathy: no cervical adenopathy. No axillary adenopathy Skin: Skin is warm and dry. No rash noted. No erythema.   Lab Results  Component Value Date   CD4TCELL 37 10/03/2014   Lab Results  Component Value Date   CD4TABS 1190 10/03/2014   CD4TABS 800 04/05/2014   CD4TABS 830 01/04/2014   Lab Results  Component Value Date   HIV1RNAQUANT <20 10/03/2014   Lab Results  Component Value Date   HEPBSAB No 08/24/2006   No results found for: RPR  CBC Lab Results  Component Value Date   WBC 7.4 10/03/2014   RBC 3.78* 10/03/2014   HGB 13.1 10/03/2014   HCT 38.4 10/03/2014   PLT 292 10/03/2014   MCV 101.6* 10/03/2014   MCH 34.7* 10/03/2014   MCHC 34.1 10/03/2014   RDW 12.7 10/03/2014   LYMPHSABS 3.2 10/03/2014   MONOABS 0.4 10/03/2014   EOSABS 0.1 10/03/2014   BASOSABS 0.0 10/03/2014   BMET Lab Results  Component Value Date   NA 144 10/03/2014   K 4.3 10/03/2014   CL 102 10/03/2014   CO2 30 10/03/2014   GLUCOSE 91 10/03/2014   BUN 8 10/03/2014   CREATININE 0.62 10/03/2014   CALCIUM 9.1 10/03/2014   GFRNONAA >89 04/05/2014   GFRAA >89 04/05/2014    Assessment and Plan  hiv disease = well controlled, no longer having CNS side effects associated with atripla. We will continue her with  genvoya. Will check labs to see that her viral load is still undetectable  Hypertension =well controlled, continue with current regimen  Elevated MCV = will recheck CBC to see if still elevated and may need to work up to see if any vitamin deficiency  Health promotion = flu vaccine in the fall

## 2015-01-25 LAB — HIV-1 RNA QUANT-NO REFLEX-BLD
HIV 1 RNA Quant: <20 copies/mL (ref <20)
HIV-1 RNA Quant, Log: <1.3 {Log} (ref <1.30)

## 2015-05-31 ENCOUNTER — Ambulatory Visit (INDEPENDENT_AMBULATORY_CARE_PROVIDER_SITE_OTHER): Payer: BLUE CROSS/BLUE SHIELD | Admitting: Internal Medicine

## 2015-05-31 VITALS — Wt 156.0 lb

## 2015-05-31 DIAGNOSIS — Z23 Encounter for immunization: Secondary | ICD-10-CM

## 2015-05-31 DIAGNOSIS — I10 Essential (primary) hypertension: Secondary | ICD-10-CM

## 2015-05-31 DIAGNOSIS — E039 Hypothyroidism, unspecified: Secondary | ICD-10-CM

## 2015-05-31 DIAGNOSIS — Z0000 Encounter for general adult medical examination without abnormal findings: Secondary | ICD-10-CM

## 2015-05-31 DIAGNOSIS — B20 Human immunodeficiency virus [HIV] disease: Secondary | ICD-10-CM | POA: Diagnosis not present

## 2015-05-31 MED ORDER — ELVITEG-COBIC-EMTRICIT-TENOFAF 150-150-200-10 MG PO TABS: 1.0000 | ORAL_TABLET | Freq: Every day | ORAL | Status: DC

## 2015-05-31 MED ORDER — ENALAPRIL-HYDROCHLOROTHIAZIDE 10-25 MG PO TABS: ORAL_TABLET | ORAL | Status: DC

## 2015-05-31 MED ORDER — LEVOTHYROXINE SODIUM 125 MCG PO TABS: 125.0000 ug | ORAL_TABLET | Freq: Every day | ORAL | Status: DC

## 2015-05-31 NOTE — Progress Notes (Signed)
Patient ID: Tamara Lucas, female   DOB: 08/10/1955, 59 y.o.   MRN: [id number]      Rfv:  Follow up for hiv disease  Patient ID: Tamara Lucas, female   DOB: 06/06/1956, 59 y.o.   MRN: [id number]  HPI 59yo F with HIV disease, Cd 4 count 1190/VL<20, on genvoya. In good health since last visit in the summer. She needs refills on meds.  Has no complaints about her health.  Outpatient Encounter Prescriptions as of 05/31/2015  Medication Sig  . acetaminophen (TYLENOL) 325 MG tablet Take 325 mg by mouth as needed.  . elvitegravir-cobicistat-emtricitabine-tenofovir (GENVOYA) 150-150-200-10 MG TABS tablet Take 1 tablet by mouth daily with breakfast.  . enalapril-hydrochlorothiazide (VASERETIC) 10-25 MG per tablet take 1 tablet by mouth once daily  . levothyroxine (SYNTHROID) 125 MCG tablet Take 1 tablet (125 mcg total) by mouth daily.   No facility-administered encounter medications on file as of 05/31/2015.     Patient Active Problem List   Diagnosis Date Noted  . HIV DISEASE 08/28/2006  . HYPOTHYROIDISM 08/28/2006  . HYPERTENSION 08/28/2006     Health Maintenance Due  Topic Date Due  . TETANUS/TDAP  09/29/1972  . PAP SMEAR  05/19/2010  . ZOSTAVAX  09/29/2013  . INFLUENZA VACCINE  01/29/2015     Review of Systems  Constitutional: Negative for fever, chills, diaphoresis, activity change, appetite change, fatigue and unexpected weight change.  HENT: Negative for congestion, sore throat, rhinorrhea, sneezing, trouble swallowing and sinus pressure.  Eyes: Negative for photophobia and visual disturbance.  Respiratory: Negative for cough, chest tightness, shortness of breath, wheezing and stridor.  Cardiovascular: Negative for chest pain, palpitations and leg swelling.  Gastrointestinal: Negative for nausea, vomiting, abdominal pain, diarrhea, constipation, blood in stool, abdominal distention and anal bleeding.  Genitourinary: Negative for dysuria, hematuria, flank pain and difficulty  urinating.  Musculoskeletal: Negative for myalgias, back pain, joint swelling, arthralgias and gait problem.  Skin: Negative for color change, pallor, rash and wound.  Neurological: Negative for dizziness, tremors, weakness and light-headedness.  Hematological: Negative for adenopathy. Does not bruise/bleed easily.  Psychiatric/Behavioral: Negative for behavioral problems, confusion, sleep disturbance, dysphoric mood, decreased concentration and agitation.    Physical Exam   Wt 156 lb (70.761 kg) Physical Exam  Constitutional:  oriented to person, place, and time. appears well-developed and well-nourished. No distress.  HENT: [place]/AT, PERRLA, no scleral icterus Mouth/Throat: Oropharynx is clear and moist. No oropharyngeal exudate.  Cardiovascular: Normal rate, regular rhythm and normal heart sounds. Exam reveals no gallop and no friction rub.  No murmur heard.  Pulmonary/Chest: Effort normal and breath sounds normal. No respiratory distress.  has no wheezes.  Neck = supple, no nuchal rigidity Abdominal: Soft. Bowel sounds are normal.  exhibits no distension. There is no tenderness.  Lymphadenopathy: no cervical adenopathy. No axillary adenopathy Neurological: alert and oriented to person, place, and time.  Skin: Skin is warm and dry. No rash noted. No erythema.  Psychiatric: a normal mood and affect.  behavior is normal.    Lab Results  Component Value Date   CD4TCELL 37 10/03/2014   Lab Results  Component Value Date   CD4TABS 1190 10/03/2014   CD4TABS 800 04/05/2014   CD4TABS 830 01/04/2014   Lab Results  Component Value Date   HIV1RNAQUANT <20 01/23/2015   Lab Results  Component Value Date   HEPBSAB No 08/24/2006   No results found for: RPR  CBC Lab Results  Component Value Date   WBC 5.3 01/23/2015  RBC 3.67* 01/23/2015   HGB 12.2 01/23/2015   HCT 36.0 01/23/2015   PLT 239 01/23/2015   MCV 98.1 01/23/2015   MCH 33.2 01/23/2015   MCHC 33.9 01/23/2015   RDW  12.1 01/23/2015   LYMPHSABS 2.4 01/23/2015   MONOABS 0.4 01/23/2015   EOSABS 0.1 01/23/2015   BASOSABS 0.1 01/23/2015   BMET Lab Results  Component Value Date   NA 140 01/23/2015   K 4.3 01/23/2015   CL 105 01/23/2015   CO2 25 01/23/2015   GLUCOSE 96 01/23/2015   BUN 15 01/23/2015   CREATININE 0.70 01/23/2015   CALCIUM 9.5 01/23/2015   GFRNONAA >89 01/23/2015   GFRAA >89 01/23/2015     Assessment and Plan  hiv disease = continue on genvoya  Health maintenance = will give flu shot. mammo due in janurayr 2017  htn = we will refill her meds. No med adjstment at this time  Hypothyroidism = does not exhibit fatigue, depression, constipation. Will refill thyroid replacement meds  rtc in 6 month , labs 2 wk prior

## 2015-06-12 ENCOUNTER — Other Ambulatory Visit: Payer: Self-pay

## 2015-06-12 DIAGNOSIS — Z1231 Encounter for screening mammogram for malignant neoplasm of breast: Secondary | ICD-10-CM

## 2015-06-16 ENCOUNTER — Other Ambulatory Visit: Payer: Self-pay | Admitting: Internal Medicine

## 2015-07-16 ENCOUNTER — Ambulatory Visit
Admission: RE | Admit: 2015-07-16 | Discharge: 2015-07-16 | Disposition: A | Discharge status: Home / Self Care | Payer: BLUE CROSS/BLUE SHIELD | Source: Ambulatory Visit

## 2015-07-16 DIAGNOSIS — Z1231 Encounter for screening mammogram for malignant neoplasm of breast: Secondary | ICD-10-CM

## 2015-08-17 ENCOUNTER — Telehealth: Payer: Self-pay | Admitting: Infectious Diseases

## 2015-08-17 NOTE — Telephone Encounter (Signed)
Pt called to say that her rx provider had changed and that she only had 2 pills left.  She called asking for provider to call her pharmacy for expedited shipping of her next rx.  I called for her and also asked that she call the clinic on Monday as it was unlikely she would get the rx prior to her medication running out. i offered that we may have samples to bridge her til her rx arrives.

## 2015-08-20 ENCOUNTER — Other Ambulatory Visit: Payer: Self-pay | Admitting: Pharmacist Clinician (PhC)/ Clinical Pharmacy Specialist

## 2015-08-20 ENCOUNTER — Other Ambulatory Visit: Payer: Self-pay | Admitting: *Deleted

## 2015-08-20 DIAGNOSIS — B20 Human immunodeficiency virus [HIV] disease: Secondary | ICD-10-CM

## 2015-08-20 MED ORDER — ELVITEG-COBIC-EMTRICIT-TENOFAF 150-150-200-10 MG PO TABS: 1.0000 | ORAL_TABLET | Freq: Every day | ORAL | Status: DC

## 2015-08-20 MED ORDER — ENALAPRIL-HYDROCHLOROTHIAZIDE 10-25 MG PO TABS: ORAL_TABLET | ORAL | Status: DC

## 2015-08-20 MED ORDER — LEVOTHYROXINE SODIUM 125 MCG PO TABS: 125.0000 ug | ORAL_TABLET | Freq: Every day | ORAL | Status: DC

## 2015-08-20 NOTE — Progress Notes (Signed)
Apparently, med has to go to [facility] to be filled. Rx has been tx and told them to overnight it to her.

## 2015-11-08 ENCOUNTER — Other Ambulatory Visit: Payer: Self-pay | Admitting: Internal Medicine

## 2015-11-08 DIAGNOSIS — E039 Hypothyroidism, unspecified: Secondary | ICD-10-CM

## 2015-11-08 DIAGNOSIS — E038 Other specified hypothyroidism: Secondary | ICD-10-CM

## 2015-11-28 ENCOUNTER — Other Ambulatory Visit: Payer: BLUE CROSS/BLUE SHIELD

## 2015-11-28 DIAGNOSIS — B20 Human immunodeficiency virus [HIV] disease: Secondary | ICD-10-CM

## 2015-11-28 DIAGNOSIS — Z113 Encounter for screening for infections with a predominantly sexual mode of transmission: Secondary | ICD-10-CM

## 2015-11-28 DIAGNOSIS — Z79899 Other long term (current) drug therapy: Secondary | ICD-10-CM

## 2015-11-28 LAB — CBC WITH DIFFERENTIAL/PLATELET
Basophils Absolute: 57 cells/uL (ref 0–200)
Basophils Relative: 1 %
Eosinophils Absolute: 57 cells/uL (ref 15–500)
Eosinophils Relative: 1 %
HCT: 38.2 % (ref 35.0–45.0)
Hemoglobin: 13.1 g/dL (ref 11.7–15.5)
Lymphocytes Relative: 38 %
Lymphs Abs: 2166 cells/uL (ref 850–3900)
MCH: 33.1 pg — ABNORMAL HIGH (ref 27.0–33.0)
MCHC: 34.3 g/dL (ref 32.0–36.0)
MCV: 96.5 fL (ref 80.0–100.0)
MPV: 10.3 fL (ref 7.5–12.5)
Monocytes Absolute: 513 cells/uL (ref 200–950)
Monocytes Relative: 9 %
Neutro Abs: 2907 cells/uL (ref 1500–7800)
Neutrophils Relative %: 51 %
Platelets: 255 10*3/uL (ref 140–400)
RBC: 3.96 MIL/uL (ref 3.80–5.10)
RDW: 13.3 % (ref 11.0–15.0)
WBC: 5.7 10*3/uL (ref 3.8–10.8)

## 2015-11-28 LAB — LIPID PANEL
Cholesterol: 190 mg/dL (ref 125–200)
HDL: 106 mg/dL (ref >=46)
LDL Cholesterol: 67 mg/dL (ref <130)
Total CHOL/HDL Ratio: 1.8 Ratio (ref <=5.0)
Triglycerides: 86 mg/dL (ref <150)
VLDL: 17 mg/dL (ref <30)

## 2015-11-28 LAB — BASIC METABOLIC PANEL
BUN: 14 mg/dL (ref 7–25)
CO2: 26 mmol/L (ref 20–31)
Calcium: 9 mg/dL (ref 8.6–10.4)
Chloride: 104 mmol/L (ref 98–110)
Creat: 0.71 mg/dL (ref 0.50–0.99)
Glucose, Bld: 95 mg/dL (ref 65–99)
Potassium: 3.8 mmol/L (ref 3.5–5.3)
Sodium: 140 mmol/L (ref 135–146)

## 2015-11-29 ENCOUNTER — Other Ambulatory Visit: Payer: BLUE CROSS/BLUE SHIELD

## 2015-11-29 LAB — HIV-1 RNA QUANT-NO REFLEX-BLD
HIV 1 RNA Quant: <20 copies/mL (ref <20)
HIV-1 RNA Quant, Log: <1.3 Log copies/mL (ref <1.30)

## 2015-11-29 LAB — RPR

## 2015-11-29 LAB — T-HELPER CELL (CD4) - (RCID CLINIC ONLY)
CD4 % Helper T Cell: 38 % (ref 33–55)
CD4 T Cell Abs: 900 /uL (ref 400–2700)

## 2015-11-29 LAB — URINE CYTOLOGY ANCILLARY ONLY
Chlamydia: NEGATIVE
Neisseria Gonorrhea: NEGATIVE

## 2015-12-13 ENCOUNTER — Ambulatory Visit (INDEPENDENT_AMBULATORY_CARE_PROVIDER_SITE_OTHER): Payer: BLUE CROSS/BLUE SHIELD | Admitting: Internal Medicine

## 2015-12-13 ENCOUNTER — Encounter: Payer: Self-pay | Admitting: Internal Medicine

## 2015-12-13 VITALS — BP 127/79 | HR 60 | Temp 98.8°F | Wt 163.0 lb

## 2015-12-13 DIAGNOSIS — B20 Human immunodeficiency virus [HIV] disease: Secondary | ICD-10-CM | POA: Diagnosis not present

## 2015-12-13 DIAGNOSIS — I10 Essential (primary) hypertension: Secondary | ICD-10-CM | POA: Diagnosis not present

## 2015-12-13 DIAGNOSIS — R350 Frequency of micturition: Secondary | ICD-10-CM

## 2015-12-13 NOTE — Progress Notes (Signed)
Patient ID: Tamara Lucas, female   DOB: 05/30/1956, 60 y.o.   MRN: [id number]       Patient ID: Tamara Lucas, female   DOB: 04/10/1956, 60 y.o.   MRN: [id number]  HPI Tamara BondsGloria is a 60yo F with HIV disease, CD 4 count of 900/VL<20 on genvoya. Takes it regularly. Did have a brief break of 5 days due to difficulty with change in pharmacy and insurance. She has been drinking more fluids (her nutritionist recommendation ) but noticed increased urination. No dysuria, flank pain, or fevers/nightsweats/rigors  Outpatient Encounter Prescriptions as of 12/13/2015  Medication Sig  . acetaminophen (TYLENOL) 325 MG tablet Take 325 mg by mouth as needed.  . elvitegravir-cobicistat-emtricitabine-tenofovir (GENVOYA) 150-150-200-10 MG TABS tablet Take 1 tablet by mouth daily with breakfast.  . enalapril-hydrochlorothiazide (VASERETIC) 10-25 MG tablet take 1 tablet by mouth once daily  . levothyroxine (SYNTHROID, LEVOTHROID) 125 MCG tablet TAKE 1 TABLET BY MOUTH DAILY   No facility-administered encounter medications on file as of 12/13/2015.     Patient Active Problem List   Diagnosis Date Noted  . HIV DISEASE 08/28/2006  . HYPOTHYROIDISM 08/28/2006  . HYPERTENSION 08/28/2006     Health Maintenance Due  Topic Date Due  . TETANUS/TDAP  09/29/1972  . PAP SMEAR  05/19/2010  . ZOSTAVAX  09/29/2013     Review of Systems +frequent urination. 10 point ros is negative Physical Exam   BP 127/79 mmHg  Pulse 60  Temp(Src) 98.8 F (37.1 C) (Oral)  Wt 163 lb (73.936 kg) Physical Exam  Constitutional:  oriented to person, place, and time. appears well-developed and well-nourished. No distress.  HENT: [name], PERRLA, no scleral icterus Mouth/Throat: Oropharynx is clear and moist. No oropharyngeal exudate.  Cardiovascular: Normal rate, regular rhythm and normal heart sounds. Exam reveals no gallop and no friction rub.  No murmur heard.  Pulmonary/Chest: Effort normal and breath sounds normal. No  respiratory distress.  has no wheezes.  Neck = supple, no nuchal rigidity Abdominal: Soft. Bowel sounds are normal.  exhibits no distension. There is no tenderness.  Lymphadenopathy: no cervical adenopathy. No axillary adenopathy Neurological: alert and oriented to person, place, and time.  Skin: Skin is warm and dry. No rash noted. No erythema.  Psychiatric: a normal mood and affect.  behavior is normal.    Lab Results  Component Value Date   CD4TCELL 38 11/28/2015   Lab Results  Component Value Date   CD4TABS 900 11/28/2015   CD4TABS 1190 10/03/2014   CD4TABS 800 04/05/2014   Lab Results  Component Value Date   HIV1RNAQUANT <20 11/28/2015   Lab Results  Component Value Date   HEPBSAB No 08/24/2006   No results found for: RPR  CBC Lab Results  Component Value Date   WBC 5.7 11/28/2015   RBC 3.96 11/28/2015   HGB 13.1 11/28/2015   HCT 38.2 11/28/2015   PLT 255 11/28/2015   MCV 96.5 11/28/2015   MCH 33.1* 11/28/2015   MCHC 34.3 11/28/2015   RDW 13.3 11/28/2015   LYMPHSABS 2166 11/28/2015   MONOABS 513 11/28/2015   EOSABS 57 11/28/2015   BASOSABS 57 11/28/2015   BMET Lab Results  Component Value Date   NA 140 11/28/2015   K 3.8 11/28/2015   CL 104 11/28/2015   CO2 26 11/28/2015   GLUCOSE 95 11/28/2015   BUN 14 11/28/2015   CREATININE 0.71 11/28/2015   CALCIUM 9.0 11/28/2015   GFRNONAA >89 01/23/2015   GFRAA >89 01/23/2015  Assessment and Plan   hiv = well controlled. Continue with current regimen  Urinary frequency = thought to be due to increase water/liquid intake  Health maintenance = flu vaccine in the fall  htn = well controlled, continue on current regimen

## 2016-01-04 ENCOUNTER — Other Ambulatory Visit: Payer: Self-pay | Admitting: Internal Medicine

## 2016-04-27 ENCOUNTER — Other Ambulatory Visit: Payer: Self-pay | Admitting: Internal Medicine

## 2016-04-27 DIAGNOSIS — E039 Hypothyroidism, unspecified: Secondary | ICD-10-CM

## 2016-04-29 ENCOUNTER — Other Ambulatory Visit: Payer: BLUE CROSS/BLUE SHIELD

## 2016-04-29 DIAGNOSIS — B20 Human immunodeficiency virus [HIV] disease: Secondary | ICD-10-CM

## 2016-04-29 LAB — CBC WITH DIFFERENTIAL/PLATELET
Basophils Absolute: 0 cells/uL (ref 0–200)
Basophils Relative: 0 %
Eosinophils Absolute: 68 cells/uL (ref 15–500)
Eosinophils Relative: 1 %
HCT: 39.6 % (ref 35.0–45.0)
Hemoglobin: 13.1 g/dL (ref 11.7–15.5)
Lymphocytes Relative: 40 %
Lymphs Abs: 2720 cells/uL (ref 850–3900)
MCH: 32.4 pg (ref 27.0–33.0)
MCHC: 33.1 g/dL (ref 32.0–36.0)
MCV: 98 fL (ref 80.0–100.0)
MPV: 11 fL (ref 7.5–12.5)
Monocytes Absolute: 476 cells/uL (ref 200–950)
Monocytes Relative: 7 %
Neutro Abs: 3536 cells/uL (ref 1500–7800)
Neutrophils Relative %: 52 %
Platelets: 257 10*3/uL (ref 140–400)
RBC: 4.04 MIL/uL (ref 3.80–5.10)
RDW: 13.6 % (ref 11.0–15.0)
WBC: 6.8 10*3/uL (ref 3.8–10.8)

## 2016-04-30 LAB — T-HELPER CELL (CD4) - (RCID CLINIC ONLY)
CD4 % Helper T Cell: 36 % (ref 33–55)
CD4 T Cell Abs: 980 /uL (ref 400–2700)

## 2016-04-30 LAB — COMPLETE METABOLIC PANEL WITH GFR
ALT: 8 U/L (ref 6–29)
AST: 13 U/L (ref 10–35)
Albumin: 4 g/dL (ref 3.6–5.1)
Alkaline Phosphatase: 76 U/L (ref 33–130)
BUN: 14 mg/dL (ref 7–25)
CO2: 32 mmol/L — ABNORMAL HIGH (ref 20–31)
Calcium: 9.2 mg/dL (ref 8.6–10.4)
Chloride: 103 mmol/L (ref 98–110)
Creat: 0.71 mg/dL (ref 0.50–0.99)
GFR, Est African American: >89 mL/min (ref >=60)
GFR, Est Non African American: >89 mL/min (ref >=60)
Glucose, Bld: 116 mg/dL — ABNORMAL HIGH (ref 65–99)
Potassium: 3.4 mmol/L — ABNORMAL LOW (ref 3.5–5.3)
Sodium: 143 mmol/L (ref 135–146)
Total Bilirubin: 0.3 mg/dL (ref 0.2–1.2)
Total Protein: 6.8 g/dL (ref 6.1–8.1)

## 2016-05-01 LAB — HIV-1 RNA QUANT-NO REFLEX-BLD
HIV 1 RNA Quant: <20 copies/mL (ref <20)
HIV-1 RNA Quant, Log: <1.3 Log copies/mL (ref <1.30)

## 2016-05-13 ENCOUNTER — Ambulatory Visit (INDEPENDENT_AMBULATORY_CARE_PROVIDER_SITE_OTHER): Payer: BLUE CROSS/BLUE SHIELD | Admitting: Internal Medicine

## 2016-05-13 VITALS — BP 115/73 | HR 68 | Temp 97.6°F | Wt 164.0 lb

## 2016-05-13 DIAGNOSIS — I10 Essential (primary) hypertension: Secondary | ICD-10-CM

## 2016-05-13 DIAGNOSIS — Z23 Encounter for immunization: Secondary | ICD-10-CM | POA: Diagnosis not present

## 2016-05-13 DIAGNOSIS — B20 Human immunodeficiency virus [HIV] disease: Secondary | ICD-10-CM | POA: Diagnosis not present

## 2016-05-13 DIAGNOSIS — E038 Other specified hypothyroidism: Secondary | ICD-10-CM

## 2016-05-13 LAB — TSH: TSH: 0.01 mIU/L — ABNORMAL LOW

## 2016-05-13 LAB — T4, FREE: Free T4: 1.8 ng/dL (ref 0.8–1.8)

## 2016-05-13 NOTE — Progress Notes (Signed)
Rfv: follow up for hiv disease  Patient ID: Tamara PaneGloria L Lucas, female   DOB: 12/24/1955, 60 y.o.   MRN: [id number]  HPI Tamara BondsGloria is a 60yo F with hiv disease, well controlled, Cd 4 count of 980/VL<20 in Oct 2017, on genovya. Overall, she is doing well. No recent illnesses. Planned her daughter's wedding in September that went well. No complaints  Outpatient Encounter Prescriptions as of 05/13/2016  Medication Sig  . acetaminophen (TYLENOL) 325 MG tablet Take 325 mg by mouth as needed.  . elvitegravir-cobicistat-emtricitabine-tenofovir (GENVOYA) 150-150-200-10 MG TABS tablet Take 1 tablet by mouth daily with breakfast.  . enalapril-hydrochlorothiazide (VASERETIC) 10-25 MG tablet take 1 tablet by mouth once daily  . enalapril-hydrochlorothiazide (VASERETIC) 10-25 MG tablet TAKE 1 TABLET BY MOUTH DAILY  . levothyroxine (SYNTHROID, LEVOTHROID) 125 MCG tablet TAKE 1 TABLET BY MOUTH DAILY   No facility-administered encounter medications on file as of 05/13/2016.      Patient Active Problem List   Diagnosis Date Noted  . HIV DISEASE 08/28/2006  . HYPOTHYROIDISM 08/28/2006  . HYPERTENSION 08/28/2006     Health Maintenance Due  Topic Date Due  . TETANUS/TDAP  09/29/1972  . PAP SMEAR  05/19/2010  . ZOSTAVAX  09/29/2013  . INFLUENZA VACCINE  01/29/2016     Review of Systems Review of Systems  Constitutional: Negative for fever, chills, diaphoresis, activity change, appetite change, fatigue and unexpected weight change.  HENT: Negative for congestion, sore throat, rhinorrhea, sneezing, trouble swallowing and sinus pressure.  Eyes: Negative for photophobia and visual disturbance.  Respiratory: Negative for cough, chest tightness, shortness of breath, wheezing and stridor.  Cardiovascular: Negative for chest pain, palpitations and leg swelling.  Gastrointestinal: Negative for nausea, vomiting, abdominal pain, diarrhea, constipation, blood in stool, abdominal distention and anal bleeding.    Genitourinary: Negative for dysuria, hematuria, flank pain and difficulty urinating.  Musculoskeletal: Negative for myalgias, back pain, joint swelling, arthralgias and gait problem.  Skin: Negative for color change, pallor, rash and wound.  Neurological: Negative for dizziness, tremors, weakness and light-headedness.  Hematological: Negative for adenopathy. Does not bruise/bleed easily.  Psychiatric/Behavioral: Negative for behavioral problems, confusion, sleep disturbance, dysphoric mood, decreased concentration and agitation.    Physical Exam   Wt 164 lb (74.4 kg)   BMI 32.03 kg/m  Physical Exam  Constitutional:  oriented to person, place, and time. appears well-developed and well-nourished. No distress.  HENT: [place]/AT, PERRLA, no scleral icterus Neck: +midline fullness concerning for thyromegaly Mouth/Throat: Oropharynx is clear and moist. No oropharyngeal exudate.  Cardiovascular: Normal rate, regular rhythm and normal heart sounds. Exam reveals no gallop and no friction rub.  No murmur heard.  Pulmonary/Chest: Effort normal and breath sounds normal. No respiratory distress.  has no wheezes.  Neck = supple, no nuchal rigidity Abdominal: Soft. Bowel sounds are normal.  exhibits no distension. There is no tenderness.  Lymphadenopathy: no cervical adenopathy. No axillary adenopathy Neurological: alert and oriented to person, place, and time.  Skin: Skin is warm and dry. No rash noted. No erythema.  Psychiatric: a normal mood and affect.  behavior is normal.   Lab Results  Component Value Date   CD4TCELL 36 04/29/2016   Lab Results  Component Value Date   CD4TABS 980 04/29/2016   CD4TABS 900 11/28/2015   CD4TABS 1,190 10/03/2014   Lab Results  Component Value Date   HIV1RNAQUANT <20 04/29/2016   Lab Results  Component Value Date   HEPBSAB No 08/24/2006   No results found for: RPR  CBC Lab Results  Component Value Date   WBC 6.8 04/29/2016   RBC 4.04 04/29/2016    HGB 13.1 04/29/2016   HCT 39.6 04/29/2016   PLT 257 04/29/2016   MCV 98.0 04/29/2016   MCH 32.4 04/29/2016   MCHC 33.1 04/29/2016   RDW 13.6 04/29/2016   LYMPHSABS 2,720 04/29/2016   MONOABS 476 04/29/2016   EOSABS 68 04/29/2016   BASOSABS 0 04/29/2016   BMET Lab Results  Component Value Date   NA 143 04/29/2016   K 3.4 (L) 04/29/2016   CL 103 04/29/2016   CO2 32 (H) 04/29/2016   GLUCOSE 116 (H) 04/29/2016   BUN 14 04/29/2016   CREATININE 0.71 04/29/2016   CALCIUM 9.2 04/29/2016   GFRNONAA >89 04/29/2016   GFRAA >89 04/29/2016     Assessment and Plan  HIV disease = well controlled, continue on genvoya  HTN = well controlled, no need for change to home meds  Hypothyroidism = will check tsh and FT4 to ensure she is at goal  Health maintenance = mammo is due in Jan 2018

## 2016-05-15 ENCOUNTER — Other Ambulatory Visit: Payer: Self-pay | Admitting: *Deleted

## 2016-05-15 DIAGNOSIS — E039 Hypothyroidism, unspecified: Secondary | ICD-10-CM

## 2016-05-15 DIAGNOSIS — Z0000 Encounter for general adult medical examination without abnormal findings: Secondary | ICD-10-CM

## 2016-05-28 ENCOUNTER — Other Ambulatory Visit: Payer: Self-pay | Admitting: *Deleted

## 2016-05-28 DIAGNOSIS — I10 Essential (primary) hypertension: Secondary | ICD-10-CM

## 2016-05-28 MED ORDER — ENALAPRIL-HYDROCHLOROTHIAZIDE 10-25 MG PO TABS: 1.0000 | ORAL_TABLET | Freq: Every day | ORAL | 1 refills | Status: DC

## 2016-06-10 ENCOUNTER — Other Ambulatory Visit: Payer: Self-pay | Admitting: Internal Medicine

## 2016-06-10 DIAGNOSIS — Z1231 Encounter for screening mammogram for malignant neoplasm of breast: Secondary | ICD-10-CM

## 2016-06-25 ENCOUNTER — Other Ambulatory Visit: Payer: Self-pay

## 2016-06-25 ENCOUNTER — Other Ambulatory Visit: Payer: Self-pay | Admitting: *Deleted

## 2016-06-25 DIAGNOSIS — E039 Hypothyroidism, unspecified: Secondary | ICD-10-CM

## 2016-06-25 DIAGNOSIS — I10 Essential (primary) hypertension: Secondary | ICD-10-CM

## 2016-06-25 MED ORDER — LEVOTHYROXINE SODIUM 125 MCG PO TABS: 125.0000 ug | ORAL_TABLET | Freq: Every day | ORAL | 3 refills | Status: DC

## 2016-06-25 MED ORDER — ENALAPRIL-HYDROCHLOROTHIAZIDE 10-25 MG PO TABS: 1.0000 | ORAL_TABLET | Freq: Every day | ORAL | 1 refills | Status: DC

## 2016-07-13 ENCOUNTER — Other Ambulatory Visit: Payer: Self-pay | Admitting: Infectious Diseases

## 2016-07-13 DIAGNOSIS — B20 Human immunodeficiency virus [HIV] disease: Secondary | ICD-10-CM

## 2016-07-21 ENCOUNTER — Ambulatory Visit
Admission: RE | Admit: 2016-07-21 | Discharge: 2016-07-21 | Disposition: A | Discharge status: Home / Self Care | Payer: BLUE CROSS/BLUE SHIELD | Source: Ambulatory Visit | Attending: Internal Medicine | Admitting: Internal Medicine

## 2016-07-21 DIAGNOSIS — Z1231 Encounter for screening mammogram for malignant neoplasm of breast: Secondary | ICD-10-CM

## 2016-09-19 ENCOUNTER — Other Ambulatory Visit: Payer: Self-pay | Admitting: Internal Medicine

## 2016-09-19 DIAGNOSIS — E039 Hypothyroidism, unspecified: Secondary | ICD-10-CM

## 2016-11-11 ENCOUNTER — Encounter: Payer: Self-pay | Admitting: Internal Medicine

## 2016-11-11 ENCOUNTER — Ambulatory Visit
Admission: RE | Admit: 2016-11-11 | Discharge: 2016-11-11 | Disposition: A | Discharge status: Home / Self Care | Payer: BLUE CROSS/BLUE SHIELD | Source: Ambulatory Visit | Attending: Internal Medicine | Admitting: Internal Medicine

## 2016-11-11 ENCOUNTER — Ambulatory Visit (INDEPENDENT_AMBULATORY_CARE_PROVIDER_SITE_OTHER): Payer: BLUE CROSS/BLUE SHIELD | Admitting: Internal Medicine

## 2016-11-11 ENCOUNTER — Other Ambulatory Visit: Payer: Self-pay | Admitting: Internal Medicine

## 2016-11-11 VITALS — BP 126/80 | HR 54 | Temp 98.0°F | Ht 60.0 in | Wt 168.0 lb

## 2016-11-11 DIAGNOSIS — R059 Cough, unspecified: Secondary | ICD-10-CM

## 2016-11-11 DIAGNOSIS — B20 Human immunodeficiency virus [HIV] disease: Secondary | ICD-10-CM

## 2016-11-11 DIAGNOSIS — R05 Cough: Secondary | ICD-10-CM

## 2016-11-11 DIAGNOSIS — R058 Other specified cough: Secondary | ICD-10-CM

## 2016-11-11 DIAGNOSIS — E039 Hypothyroidism, unspecified: Secondary | ICD-10-CM | POA: Diagnosis not present

## 2016-11-11 LAB — COMPREHENSIVE METABOLIC PANEL
ALT: 7 U/L (ref 6–29)
AST: 13 U/L (ref 10–35)
Albumin: 4.3 g/dL (ref 3.6–5.1)
Alkaline Phosphatase: 60 U/L (ref 33–130)
BUN: 14 mg/dL (ref 7–25)
CO2: 28 mmol/L (ref 20–31)
Calcium: 9.3 mg/dL (ref 8.6–10.4)
Chloride: 105 mmol/L (ref 98–110)
Creat: 0.66 mg/dL (ref 0.50–0.99)
Glucose, Bld: 91 mg/dL (ref 65–99)
Potassium: 3.3 mmol/L — ABNORMAL LOW (ref 3.5–5.3)
Sodium: 141 mmol/L (ref 135–146)
Total Bilirubin: 0.4 mg/dL (ref 0.2–1.2)
Total Protein: 7.4 g/dL (ref 6.1–8.1)

## 2016-11-11 LAB — CBC
HCT: 39 % (ref 35.0–45.0)
Hemoglobin: 13.1 g/dL (ref 11.7–15.5)
MCH: 33.3 pg — ABNORMAL HIGH (ref 27.0–33.0)
MCHC: 33.6 g/dL (ref 32.0–36.0)
MCV: 99.2 fL (ref 80.0–100.0)
MPV: 11.2 fL (ref 7.5–12.5)
Platelets: 196 10*3/uL (ref 140–400)
RBC: 3.93 MIL/uL (ref 3.80–5.10)
RDW: 14.2 % (ref 11.0–15.0)
WBC: 6.3 10*3/uL (ref 3.8–10.8)

## 2016-11-11 LAB — LIPID PANEL
Cholesterol: 231 mg/dL — ABNORMAL HIGH (ref <200)
HDL: 102 mg/dL (ref >50)
LDL Cholesterol: 113 mg/dL — ABNORMAL HIGH (ref <100)
Total CHOL/HDL Ratio: 2.3 Ratio (ref <5.0)
Triglycerides: 79 mg/dL (ref <150)
VLDL: 16 mg/dL (ref <30)

## 2016-11-11 LAB — T4, FREE: Free T4: 1.2 ng/dL (ref 0.8–1.8)

## 2016-11-11 NOTE — Progress Notes (Signed)
RFV: HIV disease follow up   Patient ID: Tamara Lucas, female   DOB: 12-08-1955, 61 y.o.   MRN: [id number]  HPI 61yo F with with hiv disease, CD 4 count 980/VL<20 (oct 2017) on genvoya. She is doing ok but still very tearful since the loss of her mother since we last saw her. She has good family support. She mentions she was recently taken off of lisinopril due to dry cough thought to be assoc with medication.  Cough appears steadily improving  Soc hx - loss of her mother  Has pcp  Outpatient Encounter Prescriptions as of 11/11/2016  Medication Sig  . acetaminophen (TYLENOL) 325 MG tablet Take 325 mg by mouth as needed.  . GENVOYA 150-150-200-10 MG TABS tablet TAKE 1 TABLET BY MOUTH DAILY WITH BREAKFAST  . levothyroxine (SYNTHROID, LEVOTHROID) 125 MCG tablet TAKE 1 TABLET BY MOUTH DAILY  . levothyroxine (SYNTHROID, LEVOTHROID) 75 MCG tablet TAKE 1 TABLET EVERY MORNING ON AN EMPTY STOMACH  . enalapril-hydrochlorothiazide (VASERETIC) 10-25 MG tablet Take 1 tablet by mouth daily. (Patient not taking: Reported on 11/11/2016)  . levothyroxine (SYNTHROID, LEVOTHROID) 125 MCG tablet Take 1 tablet (125 mcg total) by mouth daily. (Patient not taking: Reported on 11/11/2016)   No facility-administered encounter medications on file as of 11/11/2016.      Patient Active Problem List   Diagnosis Date Noted  . HIV DISEASE 08/28/2006  . HYPOTHYROIDISM 08/28/2006  . HYPERTENSION 08/28/2006     Health Maintenance Due  Topic Date Due  . TETANUS/TDAP  09/29/1972  . PAP SMEAR  05/19/2010    Soc hx: non smokier Review of Systems Review of Systems  Constitutional: Negative for fever, chills, diaphoresis, activity change, appetite change, fatigue and unexpected weight change.  HENT: Negative for congestion, sore throat, rhinorrhea, sneezing, trouble swallowing and sinus pressure.  Eyes: Negative for photophobia and visual disturbance.  Respiratory: Negative for cough, chest tightness,  shortness of breath, wheezing and stridor.  Cardiovascular: Negative for chest pain, palpitations and leg swelling.  Gastrointestinal: Negative for nausea, vomiting, abdominal pain, diarrhea, constipation, blood in stool, abdominal distention and anal bleeding.  Genitourinary: Negative for dysuria, hematuria, flank pain and difficulty urinating.  Musculoskeletal: Negative for myalgias, back pain, joint swelling, arthralgias and gait problem.  Skin: Negative for color change, pallor, rash and wound.  Neurological: Negative for dizziness, tremors, weakness and light-headedness.  Hematological: Negative for adenopathy. Does not bruise/bleed easily.  Psychiatric/Behavioral: Negative for behavioral problems, confusion, sleep disturbance, dysphoric mood, decreased concentration and agitation.    Physical Exam   BP 126/80   Pulse (!) 54   Temp 98 F (36.7 C) (Oral)   Ht 5' (1.524 m)   Wt 168 lb (76.2 kg)   BMI 32.81 kg/m   Physical Exam  Constitutional:  oriented to person, place, and time. appears well-developed and well-nourished. No distress.  HENT: [place]/AT, PERRLA, no scleral icterus Mouth/Throat: Oropharynx is clear and moist. No oropharyngeal exudate.  Cardiovascular: Normal rate, regular rhythm and normal heart sounds. Exam reveals no gallop and no friction rub.  No murmur heard.  Pulmonary/Chest: Effort normal and breath sounds normal. No respiratory distress.  has no wheezes.  Neck = supple, no nuchal rigidity Lymphadenopathy: no cervical adenopathy. No axillary adenopathy Neurological: alert and oriented to person, place, and time.  Skin: Skin is warm and dry. No rash noted. No erythema.  Psychiatric: a normal mood and affect.  behavior is normal.   Lab Results  Component Value Date   CD4TCELL  36 04/29/2016   Lab Results  Component Value Date   CD4TABS 980 04/29/2016   CD4TABS 900 11/28/2015   CD4TABS 1,190 10/03/2014   Lab Results  Component Value Date   HIV1RNAQUANT  <20 04/29/2016   Lab Results  Component Value Date   HEPBSAB No 08/24/2006   Lab Results  Component Value Date   LABRPR NON REAC 11/28/2015    CBC Lab Results  Component Value Date   WBC 6.8 04/29/2016   RBC 4.04 04/29/2016   HGB 13.1 04/29/2016   HCT 39.6 04/29/2016   PLT 257 04/29/2016   MCV 98.0 04/29/2016   MCH 32.4 04/29/2016   MCHC 33.1 04/29/2016   RDW 13.6 04/29/2016   LYMPHSABS 2,720 04/29/2016   MONOABS 476 04/29/2016   EOSABS 68 04/29/2016    BMET Lab Results  Component Value Date   NA 143 04/29/2016   K 3.4 (L) 04/29/2016   CL 103 04/29/2016   CO2 32 (H) 04/29/2016   GLUCOSE 116 (H) 04/29/2016   BUN 14 04/29/2016   CREATININE 0.71 04/29/2016   CALCIUM 9.2 04/29/2016   GFRNONAA >89 04/29/2016   GFRAA >89 04/29/2016      Assessment and Plan  Hx of hypothyroidism - check thyroid function to see if supplementation needs adjustment  hiv disease = check labs doing well with adherence. Continue on current regimen  Ace-cough = stopped her enalipril and may not need BP med

## 2016-11-11 NOTE — Patient Instructions (Signed)
No changes with your medications today. We will see you back in 6 months and can get you a copy of your thyroid function studies for your primary care doctor when available.

## 2016-11-12 LAB — TSH: TSH: 0.43 mIU/L

## 2016-11-12 LAB — T-HELPER CELL (CD4) - (RCID CLINIC ONLY)
CD4 % Helper T Cell: 35 % (ref 33–55)
CD4 T Cell Abs: 1060 /uL (ref 400–2700)

## 2016-11-13 LAB — HIV-1 RNA QUANT-NO REFLEX-BLD
HIV 1 RNA Quant: <20 copies/mL — AB
HIV-1 RNA Quant, Log: <1.3 Log copies/mL — AB

## 2016-12-16 ENCOUNTER — Telehealth: Payer: Self-pay | Admitting: *Deleted

## 2016-12-16 NOTE — Telephone Encounter (Signed)
Patient asked for her thyroid lab results to be faxed to Dr Nehemiah SettlePolite. Done via [employer]. Andree CossHowell, Ednah Hammock M, RN

## 2016-12-16 NOTE — Telephone Encounter (Signed)
Thank you for routing results

## 2016-12-20 ENCOUNTER — Other Ambulatory Visit: Payer: Self-pay | Admitting: Internal Medicine

## 2016-12-20 DIAGNOSIS — B20 Human immunodeficiency virus [HIV] disease: Secondary | ICD-10-CM

## 2017-05-14 ENCOUNTER — Encounter: Payer: Self-pay | Admitting: Internal Medicine

## 2017-05-14 ENCOUNTER — Ambulatory Visit: Payer: BLUE CROSS/BLUE SHIELD | Admitting: Internal Medicine

## 2017-05-14 VITALS — BP 136/78 | HR 68 | Temp 98.3°F | Ht 60.0 in | Wt 172.0 lb

## 2017-05-14 DIAGNOSIS — I10 Essential (primary) hypertension: Secondary | ICD-10-CM

## 2017-05-14 DIAGNOSIS — B20 Human immunodeficiency virus [HIV] disease: Secondary | ICD-10-CM

## 2017-05-14 DIAGNOSIS — Z23 Encounter for immunization: Secondary | ICD-10-CM | POA: Diagnosis not present

## 2017-05-14 DIAGNOSIS — B309 Viral conjunctivitis, unspecified: Secondary | ICD-10-CM | POA: Diagnosis not present

## 2017-05-14 LAB — COMPLETE METABOLIC PANEL WITH GFR
AG Ratio: 1.1 (calc) (ref 1.0–2.5)
ALT: 6 U/L (ref 6–29)
AST: 13 U/L (ref 10–35)
Albumin: 4.1 g/dL (ref 3.6–5.1)
Alkaline phosphatase (APISO): 75 U/L (ref 33–130)
BUN: 10 mg/dL (ref 7–25)
CO2: 27 mmol/L (ref 20–32)
Calcium: 9.3 mg/dL (ref 8.6–10.4)
Chloride: 101 mmol/L (ref 98–110)
Creat: 0.83 mg/dL (ref 0.50–0.99)
GFR, Est African American: 87 mL/min/{1.73_m2} (ref >=60)
GFR, Est Non African American: 75 mL/min/{1.73_m2} (ref >=60)
Globulin: 3.6 g/dL (calc) (ref 1.9–3.7)
Glucose, Bld: 87 mg/dL (ref 65–99)
Potassium: 3.7 mmol/L (ref 3.5–5.3)
Sodium: 141 mmol/L (ref 135–146)
Total Bilirubin: 0.3 mg/dL (ref 0.2–1.2)
Total Protein: 7.7 g/dL (ref 6.1–8.1)

## 2017-05-14 LAB — CBC WITH DIFFERENTIAL/PLATELET
Basophils Absolute: 68 cells/uL (ref 0–200)
Basophils Relative: 0.7 %
Eosinophils Absolute: 87 cells/uL (ref 15–500)
Eosinophils Relative: 0.9 %
HCT: 37.9 % (ref 35.0–45.0)
Hemoglobin: 12.9 g/dL (ref 11.7–15.5)
Lymphs Abs: 4181 cells/uL — ABNORMAL HIGH (ref 850–3900)
MCH: 33.1 pg — ABNORMAL HIGH (ref 27.0–33.0)
MCHC: 34 g/dL (ref 32.0–36.0)
MCV: 97.2 fL (ref 80.0–100.0)
MPV: 10.8 fL (ref 7.5–12.5)
Monocytes Relative: 5.6 %
Neutro Abs: 4821 cells/uL (ref 1500–7800)
Neutrophils Relative %: 49.7 %
Platelets: 278 10*3/uL (ref 140–400)
RBC: 3.9 10*6/uL (ref 3.80–5.10)
RDW: 12.3 % (ref 11.0–15.0)
Total Lymphocyte: 43.1 %
WBC mixed population: 543 cells/uL (ref 200–950)
WBC: 9.7 10*3/uL (ref 3.8–10.8)

## 2017-05-14 NOTE — Progress Notes (Signed)
RFV: follow up for HIV disease  Patient ID: Tamara Lucas, female   DOB: 04/15/1956, 61 y.o.   MRN: [id number]  HPI Tamara Lucas is a 61yo F with hiv disease, CD 4 count of 1060/VL<20 on genvoya. Doing great. Also on synthroid for hypothyroidism, down to 75mcg. She has been having upper resp infection for the past 3 days. She has been taking her genvoya regulary  Outpatient Encounter Medications as of 05/14/2017  Medication Sig  . acetaminophen (TYLENOL) 325 MG tablet Take 325 mg by mouth as needed.  . GENVOYA 150-150-200-10 MG TABS tablet TAKE 1 TABLET BY MOUTH DAILY WITH BREAKFAST  . levothyroxine (SYNTHROID, LEVOTHROID) 125 MCG tablet TAKE 1 TABLET BY MOUTH DAILY  . levothyroxine (SYNTHROID, LEVOTHROID) 75 MCG tablet TAKE 1 TABLET EVERY MORNING ON AN EMPTY STOMACH   No facility-administered encounter medications on file as of 05/14/2017.      Patient Active Problem List   Diagnosis Date Noted  . HIV DISEASE 08/28/2006  . HYPOTHYROIDISM 08/28/2006  . HYPERTENSION 08/28/2006     Health Maintenance Due  Topic Date Due  . TETANUS/TDAP  09/29/1972  . PAP SMEAR  05/19/2010  . INFLUENZA VACCINE  01/28/2017     Review of Systems  Physical Exam   BP 136/78   Pulse 68   Temp 98.3 F (36.8 C) (Oral)   Ht 5' (1.524 m)   Wt 172 lb (78 kg)   BMI 33.59 kg/m   Physical Exam  Constitutional:  oriented to person, place, and time. appears well-developed and well-nourished. No distress.  HENT: [place]/AT, PERRLA, no scleral icterus.injected eyes bilaterally.  Mouth/Throat: Oropharynx is clear and moist. No oropharyngeal exudate.  Cardiovascular: Normal rate, regular rhythm and normal heart sounds. Exam reveals no gallop and no friction rub.  No murmur heard.  Pulmonary/Chest: Effort normal and breath sounds normal. No respiratory distress.  has no wheezes.  Neck = supple, no nuchal rigidity Abdominal: Soft. Bowel sounds are normal.  exhibits no distension. There is no tenderness.    Lymphadenopathy: no cervical adenopathy. No axillary adenopathy Neurological: alert and oriented to person, place, and time.  Skin: Skin is warm and dry. No rash noted. No erythema.  Psychiatric: a normal mood and affect.  behavior is normal.   Lab Results  Component Value Date   CD4TCELL 35 11/11/2016   Lab Results  Component Value Date   CD4TABS 1,060 11/11/2016   CD4TABS 980 04/29/2016   CD4TABS 900 11/28/2015   Lab Results  Component Value Date   HIV1RNAQUANT <20 DETECTED (A) 11/11/2016   Lab Results  Component Value Date   HEPBSAB No 08/24/2006   Lab Results  Component Value Date   LABRPR NON REAC 11/28/2015    CBC Lab Results  Component Value Date   WBC 6.3 11/11/2016   RBC 3.93 11/11/2016   HGB 13.1 11/11/2016   HCT 39.0 11/11/2016   PLT 196 11/11/2016   MCV 99.2 11/11/2016   MCH 33.3 (H) 11/11/2016   MCHC 33.6 11/11/2016   RDW 14.2 11/11/2016   LYMPHSABS 2,720 04/29/2016   MONOABS 476 04/29/2016   EOSABS 68 04/29/2016    BMET Lab Results  Component Value Date   NA 141 11/11/2016   K 3.3 (L) 11/11/2016   CL 105 11/11/2016   CO2 28 11/11/2016   GLUCOSE 91 11/11/2016   BUN 14 11/11/2016   CREATININE 0.66 11/11/2016   CALCIUM 9.3 11/11/2016   GFRNONAA >89 04/29/2016   GFRAA >89 04/29/2016  Assessment and Plan  hiv disease = will get labs, anticipate ok to continue with genvoya. Plan to see if she would like to switch to biktarvy at next visit  htn = well controlled takes losartan per her pcp  [place] PeersUri = continue iwht supportive care and gave zyrtec to help with symptoms  Viral conjunctiviits= related to uri likely  Health maintenance = will get flu vaccine today

## 2017-05-15 LAB — T-HELPER CELL (CD4) - (RCID CLINIC ONLY)
CD4 % Helper T Cell: 29 % — ABNORMAL LOW (ref 33–55)
CD4 T Cell Abs: 1283 /uL (ref 400–2700)

## 2017-05-19 LAB — HIV-1 RNA QUANT-NO REFLEX-BLD
HIV 1 RNA Quant: 60 copies/mL — ABNORMAL HIGH
HIV-1 RNA Quant, Log: 1.78 Log copies/mL — ABNORMAL HIGH

## 2017-06-02 ENCOUNTER — Other Ambulatory Visit: Payer: Self-pay | Admitting: *Deleted

## 2017-06-02 DIAGNOSIS — B20 Human immunodeficiency virus [HIV] disease: Secondary | ICD-10-CM

## 2017-06-02 MED ORDER — ELVITEG-COBIC-EMTRICIT-TENOFAF 150-150-200-10 MG PO TABS: 1.0000 | ORAL_TABLET | Freq: Every day | ORAL | 1 refills | Status: DC

## 2017-06-02 MED ORDER — ELVITEG-COBIC-EMTRICIT-TENOFAF 150-150-200-10 MG PO TABS: 1.0000 | ORAL_TABLET | Freq: Every day | ORAL | 5 refills | Status: DC

## 2017-06-16 ENCOUNTER — Other Ambulatory Visit: Payer: Self-pay | Admitting: Internal Medicine

## 2017-06-16 DIAGNOSIS — Z139 Encounter for screening, unspecified: Secondary | ICD-10-CM

## 2017-07-27 ENCOUNTER — Ambulatory Visit
Admission: RE | Admit: 2017-07-27 | Discharge: 2017-07-27 | Disposition: A | Discharge status: Home / Self Care | Payer: BLUE CROSS/BLUE SHIELD | Source: Ambulatory Visit | Attending: Internal Medicine | Admitting: Internal Medicine

## 2017-07-27 DIAGNOSIS — Z139 Encounter for screening, unspecified: Secondary | ICD-10-CM

## 2017-11-19 ENCOUNTER — Other Ambulatory Visit: Payer: Self-pay | Admitting: Behavioral Health

## 2017-11-19 DIAGNOSIS — B20 Human immunodeficiency virus [HIV] disease: Secondary | ICD-10-CM

## 2017-11-19 MED ORDER — ELVITEG-COBIC-EMTRICIT-TENOFAF 150-150-200-10 MG PO TABS: 1.0000 | ORAL_TABLET | Freq: Every day | ORAL | 1 refills | Status: DC

## 2017-12-07 ENCOUNTER — Ambulatory Visit: Payer: BLUE CROSS/BLUE SHIELD | Admitting: Internal Medicine

## 2017-12-07 ENCOUNTER — Encounter: Payer: Self-pay | Admitting: Internal Medicine

## 2017-12-07 VITALS — BP 145/80 | HR 56 | Temp 98.3°F | Wt 178.0 lb

## 2017-12-07 DIAGNOSIS — E038 Other specified hypothyroidism: Secondary | ICD-10-CM | POA: Diagnosis not present

## 2017-12-07 DIAGNOSIS — Z79899 Other long term (current) drug therapy: Secondary | ICD-10-CM | POA: Diagnosis not present

## 2017-12-07 DIAGNOSIS — B20 Human immunodeficiency virus [HIV] disease: Secondary | ICD-10-CM | POA: Diagnosis not present

## 2017-12-07 MED ORDER — BICTEGRAVIR-EMTRICITAB-TENOFOV 50-200-25 MG PO TABS: 1.0000 | ORAL_TABLET | Freq: Every day | ORAL | 11 refills | Status: DC

## 2017-12-07 NOTE — Patient Instructions (Signed)
Labs on July 13th, but then appt with me in 3-4 months

## 2017-12-07 NOTE — Progress Notes (Signed)
RFV: follow up for hiv disease  Patient ID: Tamara Lucas, female   DOB: 04/11/1956, 62 y.o.   MRN: [id number]  HPI  62yo F with hiv disease, CD 4 count of 1283/VL 60 (in dec 2018) on genvoya. She reports that she just saw her pcp, dr polite who did her blood work and fecal occult screening since she did not want to do colonoscopy. She has been in good health. Since we last saw her she had her thyroid dose reduced. Outpatient Encounter Medications as of 12/07/2017  Medication Sig  . acetaminophen (TYLENOL) 325 MG tablet Take 325 mg by mouth as needed.  . elvitegravir-cobicistat-emtricitabine-tenofovir (GENVOYA) 150-150-200-10 MG TABS tablet Take 1 tablet by mouth daily with breakfast.  . levothyroxine (SYNTHROID, LEVOTHROID) 75 MCG tablet TAKE 1 TABLET EVERY MORNING ON AN EMPTY STOMACH   No facility-administered encounter medications on file as of 12/07/2017.      Patient Active Problem List   Diagnosis Date Noted  . HIV DISEASE 08/28/2006  . HYPOTHYROIDISM 08/28/2006  . HYPERTENSION 08/28/2006     Health Maintenance Due  Topic Date Due  . TETANUS/TDAP  09/29/1972  . PAP SMEAR  05/19/2010     Review of Systems Review of Systems  Constitutional: Negative for fever, chills, diaphoresis, activity change, appetite change, fatigue and unexpected weight change.  HENT: Negative for congestion, sore throat, rhinorrhea, sneezing, trouble swallowing and sinus pressure.  Eyes: Negative for photophobia and visual disturbance.  Respiratory: Negative for cough, chest tightness, shortness of breath, wheezing and stridor.  Cardiovascular: Negative for chest pain, palpitations and leg swelling.  Gastrointestinal: Negative for nausea, vomiting, abdominal pain, diarrhea, constipation, blood in stool, abdominal distention and anal bleeding.  Genitourinary: Negative for dysuria, hematuria, flank pain and difficulty urinating.  Musculoskeletal: Negative for myalgias, back pain, joint swelling,  arthralgias and gait problem.  Skin: Negative for color change, pallor, rash and wound.  Neurological: Negative for dizziness, tremors, weakness and light-headedness.  Hematological: Negative for adenopathy. Does not bruise/bleed easily.  Psychiatric/Behavioral: Negative for behavioral problems, confusion, sleep disturbance, dysphoric mood, decreased concentration and agitation.    Physical Exam   BP (!) 145/80   Pulse (!) 56   Temp 98.3 F (36.8 C) (Oral)   Wt 178 lb (80.7 kg)   BMI 34.76 kg/m   Physical Exam  Constitutional:  oriented to person, place, and time. appears well-developed and well-nourished. No distress.  HENT: [place]/AT, PERRLA, no scleral icterus Mouth/Throat: Oropharynx is clear and moist. No oropharyngeal exudate.  Cardiovascular: Normal rate, regular rhythm and normal heart sounds. Exam reveals no gallop and no friction rub.  No murmur heard.  Pulmonary/Chest: Effort normal and breath sounds normal. No respiratory distress.  has no wheezes.  Neck = supple, no nuchal rigidity  Lymphadenopathy: no cervical adenopathy. No axillary adenopathy Neurological: alert and oriented to person, place, and time.  Skin: Skin is warm and dry. No rash noted. No erythema.  Psychiatric: a normal mood and affect.  behavior is normal.   Lab Results  Component Value Date   CD4TCELL 29 (L) 05/14/2017   Lab Results  Component Value Date   CD4TABS 1,283 05/14/2017   CD4TABS 1,060 11/11/2016   CD4TABS 980 04/29/2016   Lab Results  Component Value Date   HIV1RNAQUANT 60 (H) 05/14/2017   Lab Results  Component Value Date   HEPBSAB No 08/24/2006   Lab Results  Component Value Date   LABRPR NON REAC 11/28/2015    CBC Lab Results  Component Value  Date   WBC 9.7 05/14/2017   RBC 3.90 05/14/2017   HGB 12.9 05/14/2017   HCT 37.9 05/14/2017   PLT 278 05/14/2017   MCV 97.2 05/14/2017   MCH 33.1 (H) 05/14/2017   MCHC 34.0 05/14/2017   RDW 12.3 05/14/2017   LYMPHSABS 4,181  (H) 05/14/2017   MONOABS 476 04/29/2016   EOSABS 87 05/14/2017    BMET Lab Results  Component Value Date   NA 141 05/14/2017   K 3.7 05/14/2017   CL 101 05/14/2017   CO2 27 05/14/2017   GLUCOSE 87 05/14/2017   BUN 10 05/14/2017   CREATININE 0.83 05/14/2017   CALCIUM 9.3 05/14/2017   GFRNONAA 75 05/14/2017   GFRAA 87 05/14/2017      Assessment and Plan  hiv disease = will switch her to biktarvy. Have her stop genvoya. She is due for cd 4 count and viral load which will check today. Since she is switching to a new regimen, we will check vl in roughly 4-6 wk to ensure doing ok.  Hypothyroidism = no symptoms to suggest hypo/hyperthyroidism at this time. Continue on current supplementation  Long term medication monitoring = kidney function stable according to most recent labs from PCP office

## 2017-12-08 LAB — T-HELPER CELL (CD4) - (RCID CLINIC ONLY)
CD4 % Helper T Cell: 36 % (ref 33–55)
CD4 T Cell Abs: 750 /uL (ref 400–2700)

## 2017-12-09 LAB — HIV-1 RNA QUANT-NO REFLEX-BLD
HIV 1 RNA Quant: 85 copies/mL — ABNORMAL HIGH
HIV-1 RNA Quant, Log: 1.93 Log copies/mL — ABNORMAL HIGH

## 2018-01-06 ENCOUNTER — Other Ambulatory Visit: Payer: BLUE CROSS/BLUE SHIELD

## 2018-01-06 DIAGNOSIS — B20 Human immunodeficiency virus [HIV] disease: Secondary | ICD-10-CM

## 2018-01-07 LAB — T-HELPER CELL (CD4) - (RCID CLINIC ONLY)
CD4 % Helper T Cell: 32 % — ABNORMAL LOW (ref 33–55)
CD4 T Cell Abs: 1050 /uL (ref 400–2700)

## 2018-01-07 LAB — COMPLETE METABOLIC PANEL WITH GFR
AG Ratio: 1.8 (calc) (ref 1.0–2.5)
ALT: 6 U/L (ref 6–29)
AST: 14 U/L (ref 10–35)
Albumin: 4.4 g/dL (ref 3.6–5.1)
Alkaline phosphatase (APISO): 66 U/L (ref 33–130)
BUN: 11 mg/dL (ref 7–25)
CO2: 30 mmol/L (ref 20–32)
Calcium: 9.5 mg/dL (ref 8.6–10.4)
Chloride: 102 mmol/L (ref 98–110)
Creat: 0.87 mg/dL (ref 0.50–0.99)
GFR, Est African American: 82 mL/min/{1.73_m2} (ref >=60)
GFR, Est Non African American: 70 mL/min/{1.73_m2} (ref >=60)
Globulin: 2.4 g/dL (calc) (ref 1.9–3.7)
Glucose, Bld: 122 mg/dL — ABNORMAL HIGH (ref 65–99)
Potassium: 3 mmol/L — ABNORMAL LOW (ref 3.5–5.3)
Sodium: 142 mmol/L (ref 135–146)
Total Bilirubin: 0.6 mg/dL (ref 0.2–1.2)
Total Protein: 6.8 g/dL (ref 6.1–8.1)

## 2018-01-07 LAB — CBC WITH DIFFERENTIAL/PLATELET
Basophils Absolute: 60 cells/uL (ref 0–200)
Basophils Relative: 0.7 %
Eosinophils Absolute: 43 cells/uL (ref 15–500)
Eosinophils Relative: 0.5 %
HCT: 36.8 % (ref 35.0–45.0)
Hemoglobin: 12.6 g/dL (ref 11.7–15.5)
Lymphs Abs: 3375 cells/uL (ref 850–3900)
MCH: 33.2 pg — ABNORMAL HIGH (ref 27.0–33.0)
MCHC: 34.2 g/dL (ref 32.0–36.0)
MCV: 97.1 fL (ref 80.0–100.0)
MPV: 11.1 fL (ref 7.5–12.5)
Monocytes Relative: 5.2 %
Neutro Abs: 4582 cells/uL (ref 1500–7800)
Neutrophils Relative %: 53.9 %
Platelets: 225 10*3/uL (ref 140–400)
RBC: 3.79 10*6/uL — ABNORMAL LOW (ref 3.80–5.10)
RDW: 12.5 % (ref 11.0–15.0)
Total Lymphocyte: 39.7 %
WBC mixed population: 442 cells/uL (ref 200–950)
WBC: 8.5 10*3/uL (ref 3.8–10.8)

## 2018-01-08 LAB — HIV-1 RNA QUANT-NO REFLEX-BLD
HIV 1 RNA Quant: <20 copies/mL — AB
HIV-1 RNA Quant, Log: <1.3 Log copies/mL — AB

## 2018-02-15 ENCOUNTER — Other Ambulatory Visit: Payer: Self-pay

## 2018-02-15 ENCOUNTER — Encounter (HOSPITAL_COMMUNITY): Payer: Self-pay | Admitting: Emergency Medicine

## 2018-02-15 ENCOUNTER — Emergency Department (HOSPITAL_COMMUNITY): Payer: BLUE CROSS/BLUE SHIELD

## 2018-02-15 ENCOUNTER — Emergency Department (HOSPITAL_COMMUNITY)
Admission: EM | Admit: 2018-02-15 | Discharge: 2018-02-16 | Disposition: A | Discharge status: Home / Self Care | Payer: BLUE CROSS/BLUE SHIELD | Attending: Emergency Medicine | Admitting: Emergency Medicine

## 2018-02-15 DIAGNOSIS — E039 Hypothyroidism, unspecified: Secondary | ICD-10-CM | POA: Diagnosis not present

## 2018-02-15 DIAGNOSIS — H538 Other visual disturbances: Secondary | ICD-10-CM | POA: Insufficient documentation

## 2018-02-15 DIAGNOSIS — Z21 Asymptomatic human immunodeficiency virus [HIV] infection status: Secondary | ICD-10-CM | POA: Insufficient documentation

## 2018-02-15 DIAGNOSIS — Z79899 Other long term (current) drug therapy: Secondary | ICD-10-CM | POA: Insufficient documentation

## 2018-02-15 DIAGNOSIS — R531 Weakness: Secondary | ICD-10-CM | POA: Diagnosis present

## 2018-02-15 DIAGNOSIS — I10 Essential (primary) hypertension: Secondary | ICD-10-CM | POA: Diagnosis not present

## 2018-02-15 DIAGNOSIS — R51 Headache: Secondary | ICD-10-CM | POA: Diagnosis not present

## 2018-02-15 DIAGNOSIS — R519 Headache, unspecified: Secondary | ICD-10-CM

## 2018-02-15 DIAGNOSIS — Z87891 Personal history of nicotine dependence: Secondary | ICD-10-CM | POA: Diagnosis not present

## 2018-02-15 LAB — COMPREHENSIVE METABOLIC PANEL
ALT: 13 U/L (ref 0–44)
AST: 19 U/L (ref 15–41)
Albumin: 3.6 g/dL (ref 3.5–5.0)
Alkaline Phosphatase: 60 U/L (ref 38–126)
Anion gap: 9 (ref 5–15)
BUN: 11 mg/dL (ref 8–23)
CO2: 26 mmol/L (ref 22–32)
Calcium: 9.2 mg/dL (ref 8.9–10.3)
Chloride: 107 mmol/L (ref 98–111)
Creatinine, Ser: 0.82 mg/dL (ref 0.44–1.00)
GFR calc Af Amer: >60 mL/min (ref >60)
GFR calc non Af Amer: >60 mL/min (ref >60)
Glucose, Bld: 107 mg/dL — ABNORMAL HIGH (ref 70–99)
Potassium: 2.9 mmol/L — ABNORMAL LOW (ref 3.5–5.1)
Sodium: 142 mmol/L (ref 135–145)
Total Bilirubin: 0.4 mg/dL (ref 0.3–1.2)
Total Protein: 6.7 g/dL (ref 6.5–8.1)

## 2018-02-15 LAB — I-STAT CHEM 8, ED
BUN: 11 mg/dL (ref 8–23)
Calcium, Ion: 1.11 mmol/L — ABNORMAL LOW (ref 1.15–1.40)
Chloride: 106 mmol/L (ref 98–111)
Creatinine, Ser: 0.8 mg/dL (ref 0.44–1.00)
Glucose, Bld: 99 mg/dL (ref 70–99)
HCT: 38 % (ref 36.0–46.0)
Hemoglobin: 12.9 g/dL (ref 12.0–15.0)
Potassium: 3 mmol/L — ABNORMAL LOW (ref 3.5–5.1)
Sodium: 143 mmol/L (ref 135–145)
TCO2: 26 mmol/L (ref 22–32)

## 2018-02-15 LAB — CBC
HCT: 39.3 % (ref 36.0–46.0)
Hemoglobin: 12.5 g/dL (ref 12.0–15.0)
MCH: 33.1 pg (ref 26.0–34.0)
MCHC: 31.8 g/dL (ref 30.0–36.0)
MCV: 104 fL — ABNORMAL HIGH (ref 78.0–100.0)
Platelets: 253 10*3/uL (ref 150–400)
RBC: 3.78 MIL/uL — ABNORMAL LOW (ref 3.87–5.11)
RDW: 12.7 % (ref 11.5–15.5)
WBC: 7.7 10*3/uL (ref 4.0–10.5)

## 2018-02-15 LAB — PROTIME-INR
INR: 0.97
Prothrombin Time: 12.8 seconds (ref 11.4–15.2)

## 2018-02-15 LAB — DIFFERENTIAL
Abs Immature Granulocytes: 0 10*3/uL (ref 0.0–0.1)
Basophils Absolute: 0.1 10*3/uL (ref 0.0–0.1)
Basophils Relative: 1 %
Eosinophils Absolute: 0.1 10*3/uL (ref 0.0–0.7)
Eosinophils Relative: 1 %
Immature Granulocytes: 0 %
Lymphocytes Relative: 50 %
Lymphs Abs: 3.8 10*3/uL (ref 0.7–4.0)
Monocytes Absolute: 0.5 10*3/uL (ref 0.1–1.0)
Monocytes Relative: 7 %
Neutro Abs: 3.2 10*3/uL (ref 1.7–7.7)
Neutrophils Relative %: 42 %

## 2018-02-15 LAB — I-STAT TROPONIN, ED: Troponin i, poc: 0.01 ng/mL (ref 0.00–0.08)

## 2018-02-15 LAB — APTT: aPTT: 27 seconds (ref 24–36)

## 2018-02-15 MED ORDER — PROMETHAZINE HCL 25 MG/ML IJ SOLN
12.5000 mg | Freq: Once | INTRAMUSCULAR | Status: AC
  Administered 2018-02-16: 12.5 mg via INTRAVENOUS
  Filled 2018-02-15: qty 1

## 2018-02-15 NOTE — ED Notes (Signed)
Pt ambulated to bathroom with small assistance from daughter, steady gait.

## 2018-02-15 NOTE — ED Provider Notes (Addendum)
[facility] Provider Note   CSN: [id number] Arrival date & time: 02/15/18  2142     History   Chief Complaint Chief Complaint  Patient presents with  . Blurred Vision    HPI Tamara PaneGloria L Corpening is a 62 y.o. female.  HPI Patient presents with blurred vision and generalized weakness.  Began around an hour prior to arrival.  Has a dull headache.  States both eyes have vision that the lights seem extra bright.  Mild pain in the right eye.  Headache is dull.  States she has a history of migraines and this feels somewhat like previous migraines.  No localizing numbness or weakness but states sometimes her right wrist will feel numb.  No difficulty walking.  No confusion.  No trauma.  Patient's eyes are tearing but she states that it is because she is scared.  Patient has history of HIV but has a CD4 count of over thousand. History reviewed. No pertinent past medical history.  Patient Active Problem List   Diagnosis Date Noted  . HIV DISEASE 08/28/2006  . HYPOTHYROIDISM 08/28/2006  . HYPERTENSION 08/28/2006    History reviewed. No pertinent surgical history.   OB History   None      Home Medications    Prior to Admission medications   Medication Sig Start Date End Date Taking? Authorizing Provider  acetaminophen (TYLENOL) 325 MG tablet Take 650 mg by mouth every 6 (six) hours as needed for mild pain, moderate pain, fever or headache.    Yes [provider]  bictegravir-emtricitabine-tenofovir AF (BIKTARVY) 50-200-25 MG TABS tablet Take 1 tablet by mouth daily. 12/07/17  Yes Judyann MunsonSnider, Cynthia, MD  levothyroxine (SYNTHROID, LEVOTHROID) 75 MCG tablet Take 75 mcg by mouth daily before breakfast.  08/22/16  Yes [provider]  losartan-hydrochlorothiazide (HYZAAR) 50-12.5 MG tablet Take 1 tablet by mouth daily. 01/12/18  Yes [provider]    Family History No family history on file.  Social History Social History    Tobacco Use  . Smoking status: Former Smoker    Types: Cigarettes  . Smokeless tobacco: Never Used  Substance Use Topics  . Alcohol use: No    Comment: occassional  . Drug use: No     Allergies   Sulfamethoxazole-trimethoprim   Review of Systems Review of Systems  Constitutional: Negative for appetite change and fever.  HENT: Negative for congestion.   Eyes: Positive for photophobia and visual disturbance.  Respiratory: Negative for shortness of breath.   Cardiovascular: Negative for chest pain.  Gastrointestinal: Negative for abdominal pain.  Genitourinary: Negative for flank pain.  Musculoskeletal: Negative for back pain.  Skin: Negative for rash.  Neurological: Positive for weakness and headaches.  Hematological: Negative for adenopathy.  Psychiatric/Behavioral: Negative for confusion.     Physical Exam Updated Vital Signs BP (!) 161/77   Pulse (!) 53   Temp 98.9 F (37.2 C) (Oral)   Resp 16   Ht 4\' 11"  (1.499 m)   Wt 76.2 kg   SpO2 98%   BMI 33.93 kg/m   Physical Exam  Constitutional: She is oriented to person, place, and time. She appears well-developed.  HENT:  Head: Atraumatic.  Eyes: Pupils are equal, round, and reactive to light. EOM are normal.  Visual fields grossly intact by confrontation.  Neck: Neck supple.  Pulmonary/Chest: Effort normal.  Musculoskeletal: She exhibits no edema.  Neurological: She is alert and oriented to person, place, and time. No cranial nerve deficit. Coordination normal.  Patient with normal ambulation.     ED Treatments / Results  Labs (all labs ordered are listed, but only abnormal results are displayed) Labs Reviewed  CBC - Abnormal; Notable for the following components:      Result Value   RBC 3.78 (*)    MCV 104.0 (*)    All other components within normal limits  COMPREHENSIVE METABOLIC PANEL - Abnormal; Notable for the following components:   Potassium 2.9 (*)    Glucose, Bld 107 (*)    All other  components within normal limits  I-STAT CHEM 8, ED - Abnormal; Notable for the following components:   Potassium 3.0 (*)    Calcium, Ion 1.11 (*)    All other components within normal limits  PROTIME-INR  APTT  DIFFERENTIAL  ETHANOL  I-STAT TROPONIN, ED    EKG None  Radiology Ct Head Wo Contrast  Result Date: 02/15/2018 CLINICAL DATA:  Headache with blurry vision EXAM: CT HEAD WITHOUT CONTRAST TECHNIQUE: Contiguous axial images were obtained from the base of the skull through the vertex without intravenous contrast. COMPARISON:  None. FINDINGS: Brain: No acute territorial infarction, hemorrhage or intracranial mass. Prominent extra-axial CSF densities consistent with atrophy. Nonenlarged ventricles. Prominent choroidal fissures. Vascular: No hyperdense vessels.  No unexpected calcification Skull: Normal. Negative for fracture or focal lesion. Sinuses/Orbits: Mild mucosal thickening in the maxillary and ethmoid sinuses. No acute orbital abnormality Other: None IMPRESSION: 1. No CT evidence for acute intracranial abnormality. 2. Atrophy Electronically Signed   By: Jasmine PangKim  Fujinaga M.D.   On: 02/15/2018 23:56    Procedures Procedures (including critical care time)  Medications Ordered in ED Medications  promethazine (PHENERGAN) injection 12.5 mg (has no administration in time range)  ketorolac (TORADOL) 30 MG/ML injection 15 mg (has no administration in time range)     Initial Impression / Assessment and Plan / ED Course  I have reviewed the triage vital signs and the nursing notes.  Pertinent labs & imaging results that were available during my care of the patient were reviewed by me and considered in my medical decision making (see chart for details).     Patient with vision changes.  Headache.  Potentially migrainous.  Has a history of headaches.  Head CT will be done since patient has not had previously.  Labs reassuring.  Doubt this is a stroke.  Doubt it is a primary ocular  cause. Care will be turned over to Dr Gerhard Perchescardema.  Head CT reassuring.  Patient states she feels somewhat better but has not been treated yet.  Will treat with Phenergan and Toradol.  Likely discharge home.   Final Clinical Impressions(s) / ED Diagnoses   Final diagnoses:  Blurred vision  Nonintractable headache, unspecified chronicity pattern, unspecified headache type    ED Discharge Orders    None       Benjiman CorePickering, Anjalina Bergevin, MD 02/15/18 Ouida [id number]    Benjiman CorePickering, Deya Bigos, MD 02/16/18 [id number]

## 2018-02-15 NOTE — ED Notes (Signed)
Patient transported to CT 

## 2018-02-15 NOTE — ED Triage Notes (Signed)
Pt was driving approximately an hour ago and had sudden onset of blurred vision and general weakness all over. Pt states vision is still blurry. [facility] so she came here.

## 2018-02-16 LAB — ETHANOL: Alcohol, Ethyl (B): <10 mg/dL (ref <10)

## 2018-02-16 MED ORDER — KETOROLAC TROMETHAMINE 30 MG/ML IJ SOLN
15.0000 mg | Freq: Once | INTRAMUSCULAR | Status: AC
  Administered 2018-02-16: 15 mg via INTRAVENOUS
  Filled 2018-02-16: qty 1

## 2018-02-16 NOTE — ED Provider Notes (Signed)
I assumed care of this patient from Dr. Rubin PayorPickering at 0000.  Please see their note for further details of Hx, PE.  Briefly patient is a 62 y.o. female who presented with visual disturbance.  No focal deficits.  Thought to be related to migraine headaches.  CT negative..   Current plan is to reevaluate the patient following migraine cocktail.  On reevaluation, patient reports significant improvement regarding her visual disturbance and headache.  The patient is safe for discharge with strict return precautions.  Disposition: Discharge  Condition: Good  I have discussed the results, Dx and Tx plan with the patient and daughter who expressed understanding and agree(s) with the plan. Discharge instructions discussed at great length. The patient and daughter was given strict return precautions who verbalized understanding of the instructions. No further questions at time of discharge.    ED Discharge Orders    None       Follow Up: Renford DillsPolite, Ronald, MD [address] [place] [address] [phone]   in 3-5 days, If symptoms do not improve or  worsen       Opal Mckellips, Amadeo GarnetPedro Eduardo, MD 02/16/18 [phone]

## 2018-04-12 ENCOUNTER — Ambulatory Visit: Payer: BLUE CROSS/BLUE SHIELD | Admitting: Internal Medicine

## 2018-04-12 ENCOUNTER — Encounter: Payer: Self-pay | Admitting: Internal Medicine

## 2018-04-12 VITALS — BP 143/80 | HR 56 | Temp 98.3°F | Ht 60.0 in | Wt 177.0 lb

## 2018-04-12 DIAGNOSIS — Z79899 Other long term (current) drug therapy: Secondary | ICD-10-CM

## 2018-04-12 DIAGNOSIS — B20 Human immunodeficiency virus [HIV] disease: Secondary | ICD-10-CM | POA: Diagnosis not present

## 2018-04-12 DIAGNOSIS — Z23 Encounter for immunization: Secondary | ICD-10-CM

## 2018-04-12 DIAGNOSIS — R635 Abnormal weight gain: Secondary | ICD-10-CM | POA: Diagnosis not present

## 2018-04-12 NOTE — Progress Notes (Signed)
RFV: follow up for hiv disease  Patient ID: Tamara Lucas, female   DOB: September 15, 1955, 62 y.o.   MRN: [id number]  HPI  62yo F with hiv disease, Cd 4 count 1050/VL<20 in July 2019 on biktarvy. 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 Noticing gaining weight since shifting work Outpatient Encounter Medications as of 04/12/2018  Medication Sig  . acetaminophen (TYLENOL) 325 MG tablet Take 650 mg by mouth every 6 (six) hours as needed for mild pain, moderate pain, fever or headache.   . bictegravir-emtricitabine-tenofovir AF (BIKTARVY) 50-200-25 MG TABS tablet Take 1 tablet by mouth daily.  Marland Kitchen levothyroxine (SYNTHROID, LEVOTHROID) 75 MCG tablet Take 75 mcg by mouth daily before breakfast.   . losartan-hydrochlorothiazide (HYZAAR) 50-12.5 MG tablet Take 1 tablet by mouth daily.   No facility-administered encounter medications on file as of 04/12/2018.      Patient Active Problem List   Diagnosis Date Noted  . HIV DISEASE 08/28/2006  . HYPOTHYROIDISM 08/28/2006  . HYPERTENSION 08/28/2006     Health Maintenance Due  Topic Date Due  . TETANUS/TDAP  09/29/1972  . PAP SMEAR  05/19/2010  . INFLUENZA VACCINE  01/28/2018    Social History   Tobacco Use  . Smoking status: Former Smoker    Types: Cigarettes  . Smokeless tobacco: Never Used  Substance Use Topics  . Alcohol use: No    Comment: occassional  . Drug use: No   Review of Systems 12 point ros is negative except what is mentioned in hpi Physical Exam   BP (!) 143/80   Pulse (!) 56   Temp 98.3 F (36.8 C)   Ht 5' (1.524 m)   Wt 177 lb (80.3 kg)   BMI 34.57 kg/m   Physical Exam  Constitutional:  oriented to person, place, and time. appears well-developed and well-nourished. No distress.  HENT: [place]/AT, PERRLA, no scleral icterus Mouth/Throat: Oropharynx is clear and moist. No oropharyngeal exudate.  Cardiovascular: Normal rate, regular rhythm and normal heart sounds. Exam reveals no gallop  and no friction rub.  No murmur heard.  Pulmonary/Chest: Effort normal and breath sounds normal. No respiratory distress.  has no wheezes.  Neck = supple, no nuchal rigidity Abdominal: Soft. Bowel sounds are normal.  exhibits no distension. There is no tenderness.  Lymphadenopathy: no cervical adenopathy. No axillary adenopathy Neurological: alert and oriented to person, place, and time.  Skin: Skin is warm and dry. No rash noted. No erythema.  Psychiatric: a normal mood and affect.  behavior is normal.   Lab Results  Component Value Date   CD4TCELL 32 (L) 01/06/2018   Lab Results  Component Value Date   CD4TABS 1,050 01/06/2018   CD4TABS 750 12/07/2017   CD4TABS 1,283 05/14/2017   Lab Results  Component Value Date   HIV1RNAQUANT <20 DETECTED (A) 01/06/2018   Lab Results  Component Value Date   HEPBSAB No 08/24/2006   Lab Results  Component Value Date   LABRPR NON REAC 11/28/2015    CBC Lab Results  Component Value Date   WBC 7.7 02/15/2018   RBC 3.78 (L) 02/15/2018   HGB 12.9 02/15/2018   HCT 38.0 02/15/2018   PLT 253 02/15/2018   MCV 104.0 (H) 02/15/2018   MCH 33.1 02/15/2018   MCHC 31.8 02/15/2018   RDW 12.7 02/15/2018   LYMPHSABS 3.8 02/15/2018   MONOABS 0.5 02/15/2018   EOSABS 0.1 02/15/2018    BMET Lab Results  Component Value Date  NA 143 02/15/2018   K 3.0 (L) 02/15/2018   CL 106 02/15/2018   CO2 26 02/15/2018   GLUCOSE 99 02/15/2018   BUN 11 02/15/2018   CREATININE 0.80 02/15/2018   CALCIUM 9.2 02/15/2018   GFRNONAA >60 02/15/2018   GFRAA >60 02/15/2018      Assessment and Plan hiv disease = well controlled. Excellent adherence, and plan to continue on current regimen  Health maintenance =Will give prevnar and flu today  Weight gain = likely from eating during evening as well as sleep disturbance for recent job change. Encouraged to try to decrease carbs and sweets, possibly increase exercise. Could also be due to slowing metabolism  with age. Will continue to monitor  Long term medication management = stable cr while on hiv regimen. Will continue to follow  See back in 3 mo

## 2018-04-12 NOTE — Progress Notes (Signed)
Prevnar and Flu vaccine administered today. Patient tolerated well.  Patient to follow up with Dr. Drue Second in 4 months.  S.Brenee Gajda, LPN

## 2018-07-02 ENCOUNTER — Other Ambulatory Visit: Payer: Self-pay | Admitting: Internal Medicine

## 2018-07-02 DIAGNOSIS — Z1231 Encounter for screening mammogram for malignant neoplasm of breast: Secondary | ICD-10-CM

## 2018-07-30 ENCOUNTER — Ambulatory Visit: Payer: BLUE CROSS/BLUE SHIELD

## 2018-07-30 ENCOUNTER — Ambulatory Visit
Admission: RE | Admit: 2018-07-30 | Discharge: 2018-07-30 | Disposition: A | Discharge status: Home / Self Care | Payer: BLUE CROSS/BLUE SHIELD | Source: Ambulatory Visit | Attending: Internal Medicine | Admitting: Internal Medicine

## 2018-07-30 DIAGNOSIS — Z1231 Encounter for screening mammogram for malignant neoplasm of breast: Secondary | ICD-10-CM

## 2018-08-02 ENCOUNTER — Ambulatory Visit (INDEPENDENT_AMBULATORY_CARE_PROVIDER_SITE_OTHER): Payer: BLUE CROSS/BLUE SHIELD | Admitting: Internal Medicine

## 2018-08-02 VITALS — BP 154/85 | HR 57 | Temp 98.1°F | Wt 144.0 lb

## 2018-08-02 DIAGNOSIS — I10 Essential (primary) hypertension: Secondary | ICD-10-CM

## 2018-08-02 DIAGNOSIS — Z79899 Other long term (current) drug therapy: Secondary | ICD-10-CM | POA: Diagnosis not present

## 2018-08-02 DIAGNOSIS — B20 Human immunodeficiency virus [HIV] disease: Secondary | ICD-10-CM

## 2018-08-02 NOTE — Progress Notes (Signed)
RFV: follow up for hiv disease  Patient ID: Tamara Lucas, female   DOB: [birth year], 63 y.o.   MRN: [id number]  HPI 63yo F with hiv disease, CD 4 count of 1050/VL<20 in July, on biktarvy.doing well with adherence. Just celebrated 15yr at her work. She currently works 4 shifts of 10hrs and feels tired from this schedule. She is planning to retire in 2 months  Outpatient Encounter Medications as of 08/02/2018  Medication Sig  . acetaminophen (TYLENOL) 325 MG tablet Take 650 mg by mouth every 6 (six) hours as needed for mild pain, moderate pain, fever or headache.   . bictegravir-emtricitabine-tenofovir AF (BIKTARVY) 50-200-25 MG TABS tablet Take 1 tablet by mouth daily.  Marland Kitchen levothyroxine (SYNTHROID, LEVOTHROID) 75 MCG tablet Take 75 mcg by mouth daily before breakfast.   . losartan-hydrochlorothiazide (HYZAAR) 50-12.5 MG tablet Take 1 tablet by mouth daily.   No facility-administered encounter medications on file as of 08/02/2018.      Patient Active Problem List   Diagnosis Date Noted  . HIV DISEASE 08/28/2006  . HYPOTHYROIDISM 08/28/2006  . HYPERTENSION 08/28/2006     Health Maintenance Due  Topic Date Due  . TETANUS/TDAP  09/29/1972  . PAP SMEAR-Modifier  05/19/2010    Social History   Tobacco Use  . Smoking status: Former Smoker    Types: Cigarettes  . Smokeless tobacco: Never Used  Substance Use Topics  . Alcohol use: No    Comment: occassional  . Drug use: No   Review of Systems Review of Systems  Constitutional: Negative for fever, chills, diaphoresis, activity change, appetite change, fatigue and unexpected weight change.  HENT: Negative for congestion, sore throat, rhinorrhea, sneezing, trouble swallowing and sinus pressure.  Eyes: Negative for photophobia and visual disturbance.  Respiratory: Negative for cough, chest tightness, shortness of breath, wheezing and stridor.  Cardiovascular: Negative for chest pain, palpitations and leg swelling.    Gastrointestinal: Negative for nausea, vomiting, abdominal pain, diarrhea, constipation, blood in stool, abdominal distention and anal bleeding.  Genitourinary: Negative for dysuria, hematuria, flank pain and difficulty urinating.  Musculoskeletal: Negative for myalgias, back pain, joint swelling, arthralgias and gait problem.  Skin: Negative for color change, pallor, rash and wound.  Neurological: Negative for dizziness, tremors, weakness and light-headedness.  Hematological: Negative for adenopathy. Does not bruise/bleed easily.  Psychiatric/Behavioral: Negative for behavioral problems, confusion, sleep disturbance, dysphoric mood, decreased concentration and agitation.    Physical Exam   BP (!) 154/85   Pulse (!) 57   Temp 98.1 F (36.7 C)   Wt 144 lb (65.3 kg)   BMI 28.12 kg/m   Physical Exam  Constitutional:  oriented to person, place, and time. appears fatigued and and well-nourished. No distress.  HENT: [place]/AT, PERRLA, no scleral icterus Mouth/Throat: Oropharynx is clear and moist. No oropharyngeal exudate.  Cardiovascular: Normal rate, regular rhythm and normal heart sounds. Exam reveals no gallop and no friction rub.  No murmur heard.  Pulmonary/Chest: Effort normal and breath sounds normal. No respiratory distress.  has no wheezes.  Neck = supple, no nuchal rigidity Abdominal: Soft. Bowel sounds are normal.  exhibits no distension. There is no tenderness.  Lymphadenopathy: no cervical adenopathy. No axillary adenopathy Neurological: alert and oriented to person, place, and time.  Skin: Skin is warm and dry. No rash noted. No erythema.  Psychiatric: a normal mood and affect.  behavior is normal.   Lab Results  Component Value Date   CD4TCELL 32 (L) 01/06/2018   Lab Results  Component Value Date   CD4TABS 1,050 01/06/2018   CD4TABS 750 12/07/2017   CD4TABS 1,283 05/14/2017   Lab Results  Component Value Date   HIV1RNAQUANT <20 DETECTED (A) 01/06/2018   Lab  Results  Component Value Date   HEPBSAB No 08/24/2006   Lab Results  Component Value Date   LABRPR NON REAC 11/28/2015    CBC Lab Results  Component Value Date   WBC 7.7 02/15/2018   RBC 3.78 (L) 02/15/2018   HGB 12.9 02/15/2018   HCT 38.0 02/15/2018   PLT 253 02/15/2018   MCV 104.0 (H) 02/15/2018   MCH 33.1 02/15/2018   MCHC 31.8 02/15/2018   RDW 12.7 02/15/2018   LYMPHSABS 3.8 02/15/2018   MONOABS 0.5 02/15/2018   EOSABS 0.1 02/15/2018    BMET Lab Results  Component Value Date   NA 143 02/15/2018   K 3.0 (L) 02/15/2018   CL 106 02/15/2018   CO2 26 02/15/2018   GLUCOSE 99 02/15/2018   BUN 11 02/15/2018   CREATININE 0.80 02/15/2018   CALCIUM 9.2 02/15/2018   GFRNONAA >60 02/15/2018   GFRAA >60 02/15/2018      Assessment and Plan  hiv disesae = she is well controlled with biktarvy. Plan on continuing her current regimen and we Will check labs  htn = slightly elevated today. Will recocmmend to see if stil elevated at her pcp office  Long term medication = cr is stable in the past, and will check BMP to ensure no need for any changes with hiv regimen

## 2018-08-03 LAB — T-HELPER CELL (CD4) - (RCID CLINIC ONLY)
CD4 % Helper T Cell: 37 % (ref 33–55)
CD4 T Cell Abs: 1410 /uL (ref 400–2700)

## 2018-08-04 LAB — CBC WITH DIFFERENTIAL/PLATELET
Absolute Monocytes: 524 cells/uL (ref 200–950)
Basophils Absolute: 38 cells/uL (ref 0–200)
Basophils Relative: 0.5 %
Eosinophils Absolute: 68 cells/uL (ref 15–500)
Eosinophils Relative: 0.9 %
HCT: 38 % (ref 35.0–45.0)
Hemoglobin: 12.9 g/dL (ref 11.7–15.5)
Lymphs Abs: 3701 cells/uL (ref 850–3900)
MCH: 32.7 pg (ref 27.0–33.0)
MCHC: 33.9 g/dL (ref 32.0–36.0)
MCV: 96.2 fL (ref 80.0–100.0)
MPV: 11.6 fL (ref 7.5–12.5)
Monocytes Relative: 6.9 %
Neutro Abs: 3268 cells/uL (ref 1500–7800)
Neutrophils Relative %: 43 %
Platelets: 243 10*3/uL (ref 140–400)
RBC: 3.95 10*6/uL (ref 3.80–5.10)
RDW: 12.7 % (ref 11.0–15.0)
Total Lymphocyte: 48.7 %
WBC: 7.6 10*3/uL (ref 3.8–10.8)

## 2018-08-04 LAB — COMPLETE METABOLIC PANEL WITH GFR
AG Ratio: 1.5 (calc) (ref 1.0–2.5)
ALT: 8 U/L (ref 6–29)
AST: 13 U/L (ref 10–35)
Albumin: 4.2 g/dL (ref 3.6–5.1)
Alkaline phosphatase (APISO): 72 U/L (ref 37–153)
BUN: 12 mg/dL (ref 7–25)
CO2: 31 mmol/L (ref 20–32)
Calcium: 9.5 mg/dL (ref 8.6–10.4)
Chloride: 106 mmol/L (ref 98–110)
Creat: 0.77 mg/dL (ref 0.50–0.99)
GFR, Est African American: 95 mL/min/{1.73_m2} (ref >=60)
GFR, Est Non African American: 82 mL/min/{1.73_m2} (ref >=60)
Globulin: 2.8 g/dL (calc) (ref 1.9–3.7)
Glucose, Bld: 90 mg/dL (ref 65–99)
Potassium: 3.5 mmol/L (ref 3.5–5.3)
Sodium: 145 mmol/L (ref 135–146)
Total Bilirubin: 0.3 mg/dL (ref 0.2–1.2)
Total Protein: 7 g/dL (ref 6.1–8.1)

## 2018-08-04 LAB — HIV-1 RNA QUANT-NO REFLEX-BLD
HIV 1 RNA Quant: 48 copies/mL — ABNORMAL HIGH
HIV-1 RNA Quant, Log: 1.68 Log copies/mL — ABNORMAL HIGH

## 2018-10-07 ENCOUNTER — Other Ambulatory Visit: Payer: Self-pay | Admitting: Behavioral Health

## 2018-10-07 DIAGNOSIS — B20 Human immunodeficiency virus [HIV] disease: Secondary | ICD-10-CM

## 2018-10-07 MED ORDER — BICTEGRAVIR-EMTRICITAB-TENOFOV 50-200-25 MG PO TABS: 1.0000 | ORAL_TABLET | Freq: Every day | ORAL | 6 refills | Status: DC

## 2019-01-27 ENCOUNTER — Telehealth: Payer: Self-pay | Admitting: Internal Medicine

## 2019-01-27 NOTE — Telephone Encounter (Signed)
COVID-19 Pre-Screening Questions:01/27/19 ° ° °Do you currently have a fever (>100 °F), chills or unexplained body aches? NO  ° °Are you currently experiencing new cough, shortness of breath, sore throat, runny nose? NO °•  °Have you recently travelled outside the state of [place] in the last 14 days? NO °•  °Have you been in contact with someone that is currently pending confirmation of Covid19 testing or has been confirmed to have the Covid19 virus?  NO ° °**If the patient answers NO to ALL questions -  advise the patient to please call the clinic before coming to the office should any symptoms develop.  ° ° ° °

## 2019-01-31 ENCOUNTER — Ambulatory Visit (INDEPENDENT_AMBULATORY_CARE_PROVIDER_SITE_OTHER): Payer: BC Managed Care – PPO | Admitting: Internal Medicine

## 2019-01-31 ENCOUNTER — Other Ambulatory Visit: Payer: Self-pay

## 2019-01-31 ENCOUNTER — Encounter: Payer: Self-pay | Admitting: Internal Medicine

## 2019-01-31 VITALS — BP 117/73 | HR 52 | Temp 98.6°F | Wt 185.0 lb

## 2019-01-31 DIAGNOSIS — Z79899 Other long term (current) drug therapy: Secondary | ICD-10-CM | POA: Diagnosis not present

## 2019-01-31 DIAGNOSIS — I10 Essential (primary) hypertension: Secondary | ICD-10-CM

## 2019-01-31 DIAGNOSIS — B20 Human immunodeficiency virus [HIV] disease: Secondary | ICD-10-CM

## 2019-01-31 NOTE — Progress Notes (Signed)
RFV: follow up for hiv disease  Patient ID: Tamara Lucas, female   DOB: 11/02/1955, 63 y.o.   MRN: [id number]  HPI Tamara Lucas is a 63yo F with well controlled hiv disease, HTN, HLD. She has been in good health. [place]. She states that she has been wearing masks early on the epidemic and despite working [place] airport, not known to have come in contact with someone who has covid. Had physical exam with her PCP a few weeks ago and doing well. Found to have HLD  Outpatient Encounter Medications as of 01/31/2019  Medication Sig  . acetaminophen (TYLENOL) 325 MG tablet Take 650 mg by mouth every 6 (six) hours as needed for mild pain, moderate pain, fever or headache.   . bictegravir-emtricitabine-tenofovir AF (BIKTARVY) 50-200-25 MG TABS tablet Take 1 tablet by mouth daily.  Marland Kitchen. levothyroxine (SYNTHROID, LEVOTHROID) 75 MCG tablet Take 75 mcg by mouth daily before breakfast.   . losartan-hydrochlorothiazide (HYZAAR) 50-12.5 MG tablet Take 1 tablet by mouth daily.   No facility-administered encounter medications on file as of 01/31/2019.      Patient Active Problem List   Diagnosis Date Noted  . HIV DISEASE 08/28/2006  . HYPOTHYROIDISM 08/28/2006  . HYPERTENSION 08/28/2006     Health Maintenance Due  Topic Date Due  . TETANUS/TDAP  09/29/1972  . PAP SMEAR-Modifier  05/19/2010  . DEXA SCAN  09/30/2018  . INFLUENZA VACCINE  01/29/2019    Social History   Tobacco Use  . Smoking status: Former Smoker    Types: Cigarettes  . Smokeless tobacco: Never Used  Substance Use Topics  . Alcohol use: No    Comment: occassional  . Drug use: No   Review of Systems Review of Systems  Constitutional: Negative for fever, chills, diaphoresis, activity change, appetite change, fatigue and unexpected weight change.  HENT: Negative for congestion, sore throat, rhinorrhea, sneezing, trouble swallowing and sinus pressure.  Eyes: Negative for  photophobia and visual disturbance.  Respiratory: Negative for cough, chest tightness, shortness of breath, wheezing and stridor.  Cardiovascular: Negative for chest pain, palpitations and leg swelling.  Gastrointestinal: Negative for nausea, vomiting, abdominal pain, diarrhea, constipation, blood in stool, abdominal distention and anal bleeding.  Genitourinary: Negative for dysuria, hematuria, flank pain and difficulty urinating.  Musculoskeletal: Negative for myalgias, back pain, joint swelling, arthralgias and gait problem.  Skin: Negative for color change, pallor, rash and wound.  Neurological: Negative for dizziness, tremors, weakness and light-headedness.  Hematological: Negative for adenopathy. Does not bruise/bleed easily.  Psychiatric/Behavioral: Negative for behavioral problems, confusion, sleep disturbance, dysphoric mood, decreased concentration and agitation.    Physical Exam   BP 117/73   Pulse (!) 52   Temp 98.6 F (37 C) (Oral)   Wt 185 lb (83.9 kg)   BMI 36.13 kg/m   Physical Exam  Constitutional:  oriented to person, place, and time. appears well-developed and well-nourished. No distress.  HENT: [place]/AT, PERRLA, no scleral icterus Mouth/Throat: Oropharynx is clear and moist. No oropharyngeal exudate.  Cardiovascular: Normal rate, regular rhythm and normal heart sounds. Exam reveals no gallop and no friction rub.  No murmur heard.  Pulmonary/Chest: Effort normal and breath sounds normal. No respiratory distress.  has no wheezes.  Neck = supple, no nuchal rigidity  Lymphadenopathy: no cervical adenopathy. No axillary adenopathy Neurological: alert and oriented to person, place, and time.  Skin: Skin is warm and dry. No rash noted. No erythema.  Psychiatric:  a normal mood and affect.  behavior is normal.   Lab Results  Component Value Date   CD4TCELL 37 08/02/2018   Lab Results  Component Value Date   CD4TABS 1,410 08/02/2018   CD4TABS 1,050 01/06/2018   CD4TABS  750 12/07/2017   Lab Results  Component Value Date   HIV1RNAQUANT 48 (H) 08/02/2018   Lab Results  Component Value Date   HEPBSAB No 08/24/2006   Lab Results  Component Value Date   LABRPR NON REAC 11/28/2015    CBC Lab Results  Component Value Date   WBC 7.6 08/02/2018   RBC 3.95 08/02/2018   HGB 12.9 08/02/2018   HCT 38.0 08/02/2018   PLT 243 08/02/2018   MCV 96.2 08/02/2018   MCH 32.7 08/02/2018   MCHC 33.9 08/02/2018   RDW 12.7 08/02/2018   LYMPHSABS 3,701 08/02/2018   MONOABS 0.5 02/15/2018   EOSABS 68 08/02/2018    BMET Lab Results  Component Value Date   NA 145 08/02/2018   K 3.5 08/02/2018   CL 106 08/02/2018   CO2 31 08/02/2018   GLUCOSE 90 08/02/2018   BUN 12 08/02/2018   CREATININE 0.77 08/02/2018   CALCIUM 9.5 08/02/2018   GFRNONAA 82 08/02/2018   GFRAA 95 08/02/2018      Assessment and Plan  hiv disease= well controlled, continue on biktarvy daily.  Long term medication management = cr is stable. No need to change regimen  htn = well controlled, continue on current regimen  Health maintenance =  Flu shot in the fall rtc in 3-4 months, wil do labs prior to next visit

## 2019-04-11 ENCOUNTER — Other Ambulatory Visit: Payer: Self-pay

## 2019-04-11 DIAGNOSIS — Z20822 Contact with and (suspected) exposure to covid-19: Secondary | ICD-10-CM

## 2019-04-12 LAB — NOVEL CORONAVIRUS, NAA: SARS-CoV-2, NAA: NOT DETECTED

## 2019-05-11 ENCOUNTER — Other Ambulatory Visit: Payer: Self-pay

## 2019-05-11 ENCOUNTER — Ambulatory Visit: Payer: BC Managed Care – PPO | Admitting: Internal Medicine

## 2019-05-11 ENCOUNTER — Encounter: Payer: Self-pay | Admitting: Internal Medicine

## 2019-05-11 VITALS — BP 130/84 | HR 66 | Temp 98.6°F | Wt 186.4 lb

## 2019-05-11 DIAGNOSIS — I10 Essential (primary) hypertension: Secondary | ICD-10-CM

## 2019-05-11 DIAGNOSIS — Z79899 Other long term (current) drug therapy: Secondary | ICD-10-CM | POA: Diagnosis not present

## 2019-05-11 DIAGNOSIS — B20 Human immunodeficiency virus [HIV] disease: Secondary | ICD-10-CM | POA: Diagnosis not present

## 2019-05-11 DIAGNOSIS — R635 Abnormal weight gain: Secondary | ICD-10-CM

## 2019-05-11 DIAGNOSIS — Z23 Encounter for immunization: Secondary | ICD-10-CM

## 2019-05-11 MED ORDER — BICTEGRAVIR-EMTRICITAB-TENOFOV 50-200-25 MG PO TABS: 1.0000 | ORAL_TABLET | Freq: Every day | ORAL | 6 refills | Status: DC

## 2019-05-11 NOTE — Progress Notes (Signed)
RFV: follow up for hiv disease  Patient ID: Tamara Lucas, female   DOB: Dec 29, 1955, 63 y.o.   MRN: [id number]  HPI Tamara Lucas is a 63yo F with well controlled hiv disease, cd 4 count 1410/VL 48, on biktarvy. She is also has htn. She reports doing well. No recent illnesses. She does subscribe to being fatigue from working second shift. She has noticed some weight gain since this summer.  Outpatient Encounter Medications as of 05/11/2019  Medication Sig  . bictegravir-emtricitabine-tenofovir AF (BIKTARVY) 50-200-25 MG TABS tablet Take 1 tablet by mouth daily.  Marland Kitchen acetaminophen (TYLENOL) 325 MG tablet Take 650 mg by mouth every 6 (six) hours as needed for mild pain, moderate pain, fever or headache.   . levothyroxine (SYNTHROID, LEVOTHROID) 75 MCG tablet Take 75 mcg by mouth daily before breakfast.   . losartan-hydrochlorothiazide (HYZAAR) 50-12.5 MG tablet Take 1 tablet by mouth daily.   No facility-administered encounter medications on file as of 05/11/2019.      Patient Active Problem List   Diagnosis Date Noted  . HIV DISEASE 08/28/2006  . HYPOTHYROIDISM 08/28/2006  . HYPERTENSION 08/28/2006     Health Maintenance Due  Topic Date Due  . TETANUS/TDAP  09/29/1972  . PAP SMEAR-Modifier  05/19/2010  . DEXA SCAN  09/30/2018  . INFLUENZA VACCINE  01/29/2019  . PNA vac Low Risk Adult (2 of 2 - PPSV23) 04/13/2019     Review of Systems Review of Systems  Constitutional: Negative for fever, chills, diaphoresis, activity change, appetite change, fatigue and + unexpected weight change.  HENT: Negative for congestion, sore throat, rhinorrhea, sneezing, trouble swallowing and sinus pressure.  Eyes: Negative for photophobia and visual disturbance.  Respiratory: Negative for cough, chest tightness, shortness of breath, wheezing and stridor.  Cardiovascular: Negative for chest pain, palpitations and leg swelling.  Gastrointestinal: Negative for nausea, vomiting, abdominal pain, diarrhea,  constipation, blood in stool, abdominal distention and anal bleeding.  Genitourinary: Negative for dysuria, hematuria, flank pain and difficulty urinating.  Musculoskeletal: Negative for myalgias, back pain, joint swelling, arthralgias and gait problem.  Skin: Negative for color change, pallor, rash and wound.  Neurological: Negative for dizziness, tremors, weakness and light-headedness.  Hematological: Negative for adenopathy. Does not bruise/bleed easily.  Psychiatric/Behavioral: Negative for behavioral problems, confusion, sleep disturbance, dysphoric mood, decreased concentration and agitation.    Physical Exam   BP 130/84   Pulse 66   Temp 98.6 F (37 C)   Wt 186 lb 6.4 oz (84.6 kg)   BMI 36.40 kg/m   Physical Exam  Constitutional:  oriented to person, place, and time. appears well-developed and well-nourished. No distress.  HENT: [place]/AT, PERRLA, no scleral icterus Mouth/Throat: Oropharynx is clear and moist. No oropharyngeal exudate.  Cardiovascular: Normal rate, regular rhythm and normal heart sounds. Exam reveals no gallop and no friction rub.  No murmur heard.  Pulmonary/Chest: Effort normal and breath sounds normal. No respiratory distress.  has no wheezes.  Neck = supple, no nuchal rigidity Abdominal: Soft. Bowel sounds are normal.  exhibits no distension. There is no tenderness.  Lymphadenopathy: no cervical adenopathy. No axillary adenopathy Neurological: alert and oriented to person, place, and time.  Skin: Skin is warm and dry. No rash noted. No erythema.  Psychiatric: a normal mood and affect.  behavior is normal.   Lab Results  Component Value Date   CD4TCELL 37 08/02/2018   Lab Results  Component Value Date   CD4TABS 1,410 08/02/2018   CD4TABS 1,050 01/06/2018   CD4TABS 750  12/07/2017   Lab Results  Component Value Date   HIV1RNAQUANT 48 (H) 08/02/2018   Lab Results  Component Value Date   HEPBSAB No 08/24/2006   Lab Results  Component Value Date    LABRPR NON REAC 11/28/2015    CBC Lab Results  Component Value Date   WBC 7.6 08/02/2018   RBC 3.95 08/02/2018   HGB 12.9 08/02/2018   HCT 38.0 08/02/2018   PLT 243 08/02/2018   MCV 96.2 08/02/2018   MCH 32.7 08/02/2018   MCHC 33.9 08/02/2018   RDW 12.7 08/02/2018   LYMPHSABS 3,701 08/02/2018   MONOABS 0.5 02/15/2018   EOSABS 68 08/02/2018    BMET Lab Results  Component Value Date   NA 145 08/02/2018   K 3.5 08/02/2018   CL 106 08/02/2018   CO2 31 08/02/2018   GLUCOSE 90 08/02/2018   BUN 12 08/02/2018   CREATININE 0.77 08/02/2018   CALCIUM 9.5 08/02/2018   GFRNONAA 82 08/02/2018   GFRAA 95 08/02/2018      Assessment and Plan  hiv disease= continue on bitkarvy will check labs today  Weight gain = consider not eating right before going to bed  htn = well controlled, continue on current regimen  Long term medication management = cr stable  Health maintenance = give flu shot

## 2019-05-12 LAB — T-HELPER CELL (CD4) - (RCID CLINIC ONLY)
CD4 % Helper T Cell: 44 % (ref 33–65)
CD4 T Cell Abs: 1242 /uL (ref 400–1790)

## 2019-05-19 LAB — COMPLETE METABOLIC PANEL WITH GFR
AG Ratio: 1.6 (calc) (ref 1.0–2.5)
ALT: 7 U/L (ref 6–29)
AST: 13 U/L (ref 10–35)
Albumin: 4.2 g/dL (ref 3.6–5.1)
Alkaline phosphatase (APISO): 66 U/L (ref 37–153)
BUN: 11 mg/dL (ref 7–25)
CO2: 31 mmol/L (ref 20–32)
Calcium: 9.7 mg/dL (ref 8.6–10.4)
Chloride: 101 mmol/L (ref 98–110)
Creat: 0.87 mg/dL (ref 0.50–0.99)
GFR, Est African American: 81 mL/min/{1.73_m2} (ref >=60)
GFR, Est Non African American: 70 mL/min/{1.73_m2} (ref >=60)
Globulin: 2.7 g/dL (calc) (ref 1.9–3.7)
Glucose, Bld: 106 mg/dL — ABNORMAL HIGH (ref 65–99)
Potassium: 3.3 mmol/L — ABNORMAL LOW (ref 3.5–5.3)
Sodium: 140 mmol/L (ref 135–146)
Total Bilirubin: 0.6 mg/dL (ref 0.2–1.2)
Total Protein: 6.9 g/dL (ref 6.1–8.1)

## 2019-05-19 LAB — HIV-1 RNA QUANT-NO REFLEX-BLD
HIV 1 RNA Quant: <20 copies/mL
HIV-1 RNA Quant, Log: <1.3 Log copies/mL

## 2019-05-19 LAB — CBC WITH DIFFERENTIAL/PLATELET
Absolute Monocytes: 547 cells/uL (ref 200–950)
Basophils Absolute: 50 cells/uL (ref 0–200)
Basophils Relative: 0.7 %
Eosinophils Absolute: 50 cells/uL (ref 15–500)
Eosinophils Relative: 0.7 %
HCT: 37.3 % (ref 35.0–45.0)
Hemoglobin: 13 g/dL (ref 11.7–15.5)
Lymphs Abs: 2851 cells/uL (ref 850–3900)
MCH: 33.5 pg — ABNORMAL HIGH (ref 27.0–33.0)
MCHC: 34.9 g/dL (ref 32.0–36.0)
MCV: 96.1 fL (ref 80.0–100.0)
MPV: 10.6 fL (ref 7.5–12.5)
Monocytes Relative: 7.6 %
Neutro Abs: 3701 cells/uL (ref 1500–7800)
Neutrophils Relative %: 51.4 %
Platelets: 258 10*3/uL (ref 140–400)
RBC: 3.88 10*6/uL (ref 3.80–5.10)
RDW: 12.3 % (ref 11.0–15.0)
Total Lymphocyte: 39.6 %
WBC: 7.2 10*3/uL (ref 3.8–10.8)

## 2019-05-19 LAB — RPR: RPR Ser Ql: NONREACTIVE

## 2019-06-01 ENCOUNTER — Other Ambulatory Visit: Payer: Self-pay | Admitting: Internal Medicine

## 2019-06-01 DIAGNOSIS — B20 Human immunodeficiency virus [HIV] disease: Secondary | ICD-10-CM

## 2019-06-30 ENCOUNTER — Other Ambulatory Visit: Payer: Self-pay | Admitting: Internal Medicine

## 2019-06-30 DIAGNOSIS — Z9289 Personal history of other medical treatment: Secondary | ICD-10-CM

## 2019-07-11 ENCOUNTER — Telehealth: Payer: Self-pay

## 2019-07-11 DIAGNOSIS — B20 Human immunodeficiency virus [HIV] disease: Secondary | ICD-10-CM

## 2019-07-11 MED ORDER — BIKTARVY 50-200-25 MG PO TABS: 1.0000 | ORAL_TABLET | Freq: Every day | ORAL | 4 refills | Status: DC

## 2019-07-11 NOTE — Telephone Encounter (Signed)
Patient has changed insurance changes.  She wanted to ensure we can send refills to new pharmacy.  Prior she was getting scripts thorough [facility] delivery.    She will not longer use [facility] as her pharmacy.   [facility] on [address] will be her preferred pharmacy.   Laurell Josephs, RN

## 2019-08-10 ENCOUNTER — Ambulatory Visit
Admission: RE | Admit: 2019-08-10 | Discharge: 2019-08-10 | Disposition: A | Discharge status: Home / Self Care | Payer: 59 | Source: Ambulatory Visit | Attending: Internal Medicine | Admitting: Internal Medicine

## 2019-08-10 ENCOUNTER — Other Ambulatory Visit: Payer: Self-pay

## 2019-08-10 DIAGNOSIS — Z9289 Personal history of other medical treatment: Secondary | ICD-10-CM

## 2019-10-07 ENCOUNTER — Other Ambulatory Visit: Payer: Self-pay | Admitting: *Deleted

## 2019-10-07 DIAGNOSIS — B20 Human immunodeficiency virus [HIV] disease: Secondary | ICD-10-CM

## 2019-10-07 MED ORDER — BIKTARVY 50-200-25 MG PO TABS: 1.0000 | ORAL_TABLET | Freq: Every day | ORAL | 4 refills | Status: DC

## 2019-11-08 ENCOUNTER — Telehealth: Payer: Self-pay

## 2019-11-08 NOTE — Telephone Encounter (Signed)
COVID-19 Pre-Screening Questions:11/08/19  Do you currently have a fever (>100 F), chills or unexplained body aches?NO  Are you currently experiencing new cough, shortness of breath, sore throat, runny nose? NO  .  Have you recently travelled outside the state of [place] in the last 14 days?NO .  Have you been in contact with someone that is currently pending confirmation of Covid19 testing or has been confirmed to have the Covid19 virus?  NO  **If the patient answers NO to ALL questions -  advise the patient to please call the clinic before coming to the office should any symptoms develop.     

## 2019-11-09 ENCOUNTER — Encounter: Payer: Self-pay | Admitting: Internal Medicine

## 2019-11-09 ENCOUNTER — Other Ambulatory Visit: Payer: Self-pay

## 2019-11-09 ENCOUNTER — Ambulatory Visit (INDEPENDENT_AMBULATORY_CARE_PROVIDER_SITE_OTHER): Payer: 59 | Admitting: Internal Medicine

## 2019-11-09 VITALS — BP 124/77 | HR 54 | Wt 185.0 lb

## 2019-11-09 DIAGNOSIS — Z79899 Other long term (current) drug therapy: Secondary | ICD-10-CM | POA: Diagnosis not present

## 2019-11-09 DIAGNOSIS — E782 Mixed hyperlipidemia: Secondary | ICD-10-CM

## 2019-11-09 DIAGNOSIS — B20 Human immunodeficiency virus [HIV] disease: Secondary | ICD-10-CM | POA: Diagnosis not present

## 2019-11-09 NOTE — Progress Notes (Signed)
RFV: follow up   Patient ID: Tamara Lucas, female   DOB: [birth year], 64 y.o.   MRN: [id number]  HPI Tamara Lucas is 64yo F with well controlled HIV disease, doing well with adherence. She reports that she is  Getting for retirement. Looking forward to stopping her job. Still works 2nd/3rd shift with decrease sleep.  Outpatient Encounter Medications as of 11/09/2019  Medication Sig  . bictegravir-emtricitabine-tenofovir AF (BIKTARVY) 50-200-25 MG TABS tablet Take 1 tablet by mouth daily.  Marland Kitchen acetaminophen (TYLENOL) 325 MG tablet Take 650 mg by mouth every 6 (six) hours as needed for mild pain, moderate pain, fever or headache.   . levothyroxine (SYNTHROID, LEVOTHROID) 75 MCG tablet Take 75 mcg by mouth daily before breakfast.   . losartan-hydrochlorothiazide (HYZAAR) 50-12.5 MG tablet Take 1 tablet by mouth daily.   No facility-administered encounter medications on file as of 11/09/2019.     Patient Active Problem List   Diagnosis Date Noted  . HIV DISEASE 08/28/2006  . HYPOTHYROIDISM 08/28/2006  . HYPERTENSION 08/28/2006     Health Maintenance Due  Topic Date Due  . COVID-19 Vaccine (1) Never done  . TETANUS/TDAP  Never done  . DEXA SCAN  Never done  . PNA vac Low Risk Adult (2 of 2 - PPSV23) 04/13/2019     Review of Systems  Constitutional: Negative for fever, chills, diaphoresis, activity change, appetite change, fatigue and unexpected weight change.  HENT: Negative for congestion, sore throat, rhinorrhea, sneezing, trouble swallowing and sinus pressure.  Eyes: Negative for photophobia and visual disturbance.  Respiratory: Negative for cough, chest tightness, shortness of breath, wheezing and stridor.  Cardiovascular: Negative for chest pain, palpitations and leg swelling.  Gastrointestinal: Negative for nausea, vomiting, abdominal pain, diarrhea, constipation, blood in stool, abdominal distention and anal bleeding.  Genitourinary: Negative for dysuria, hematuria, flank pain  and difficulty urinating.  Musculoskeletal: Negative for myalgias, back pain, joint swelling, arthralgias and gait problem.  Skin: Negative for color change, pallor, rash and wound.  Neurological: Negative for dizziness, tremors, weakness and light-headedness.  Hematological: Negative for adenopathy. Does not bruise/bleed easily.  Psychiatric/Behavioral: Negative for behavioral problems, confusion, sleep disturbance, dysphoric mood, decreased concentration and agitation.    Physical Exam   BP 124/77   Pulse (!) 54   Wt 185 lb (83.9 kg)   BMI 36.13 kg/m   Constitutional:  oriented to person, place, and time. appears well-developed and well-nourished. No distress.  HENT: [place]/AT, PERRLA, no scleral icterus Mouth/Throat: Oropharynx is clear and moist. No oropharyngeal exudate.  Cardiovascular: Normal rate, regular rhythm and normal heart sounds. Exam reveals no gallop and no friction rub.  No murmur heard.  Pulmonary/Chest: Effort normal and breath sounds normal. No respiratory distress.  has no wheezes.  Neck = supple, no nuchal rigidity Abdominal: Soft. Bowel sounds are normal.  exhibits no distension. There is no tenderness.  Lymphadenopathy: no cervical adenopathy. No axillary adenopathy Neurological: alert and oriented to person, place, and time.  Skin: Skin is warm and dry. No rash noted. No erythema.  Psychiatric: a normal mood and affect.  behavior is normal.   Lab Results  Component Value Date   CD4TCELL 44 05/11/2019   Lab Results  Component Value Date   CD4TABS 1,242 05/11/2019   CD4TABS 1,410 08/02/2018   CD4TABS 1,050 01/06/2018   Lab Results  Component Value Date   HIV1RNAQUANT <20 NOT DETECTED 05/11/2019   Lab Results  Component Value Date   HEPBSAB No 08/24/2006   Lab Results  Component Value Date   LABRPR NON-REACTIVE 05/11/2019    CBC Lab Results  Component Value Date   WBC 7.2 05/11/2019   RBC 3.88 05/11/2019   HGB 13.0 05/11/2019   HCT 37.3  05/11/2019   PLT 258 05/11/2019   MCV 96.1 05/11/2019   MCH 33.5 (H) 05/11/2019   MCHC 34.9 05/11/2019   RDW 12.3 05/11/2019   LYMPHSABS 2,851 05/11/2019   MONOABS 0.5 02/15/2018   EOSABS 50 05/11/2019    BMET Lab Results  Component Value Date   NA 140 05/11/2019   K 3.3 (L) 05/11/2019   CL 101 05/11/2019   CO2 31 05/11/2019   GLUCOSE 106 (H) 05/11/2019   BUN 11 05/11/2019   CREATININE 0.87 05/11/2019   CALCIUM 9.7 05/11/2019   GFRNONAA 70 05/11/2019   GFRAA 81 05/11/2019    Assessment and Plan  HIV disease= well controlled in the past. Lets do lab work  HLD = elevated at work  Long-term medication management = cr is stable.  Health maintenance = not vaccinated yet. Discussed how to consider taking it. Dispel misinformation during our visit

## 2019-11-10 LAB — T-HELPER CELL (CD4) - (RCID CLINIC ONLY)
CD4 % Helper T Cell: 46 % (ref 33–65)
CD4 T Cell Abs: 1192 /uL (ref 400–1790)

## 2019-11-11 LAB — LIPID PANEL
Cholesterol: 188 mg/dL (ref <200)
HDL: 69 mg/dL (ref >=50)
LDL Cholesterol (Calc): 99 mg/dL (calc)
Non-HDL Cholesterol (Calc): 119 mg/dL (calc) (ref <130)
Total CHOL/HDL Ratio: 2.7 (calc) (ref <5.0)
Triglycerides: 103 mg/dL (ref <150)

## 2019-11-11 LAB — CBC WITH DIFFERENTIAL/PLATELET
Absolute Monocytes: 510 cells/uL (ref 200–950)
Basophils Absolute: 41 cells/uL (ref 0–200)
Basophils Relative: 0.6 %
Eosinophils Absolute: 41 cells/uL (ref 15–500)
Eosinophils Relative: 0.6 %
HCT: 37.9 % (ref 35.0–45.0)
Hemoglobin: 12.9 g/dL (ref 11.7–15.5)
Lymphs Abs: 2468 cells/uL (ref 850–3900)
MCH: 33.4 pg — ABNORMAL HIGH (ref 27.0–33.0)
MCHC: 34 g/dL (ref 32.0–36.0)
MCV: 98.2 fL (ref 80.0–100.0)
MPV: 10.6 fL (ref 7.5–12.5)
Monocytes Relative: 7.5 %
Neutro Abs: 3740 cells/uL (ref 1500–7800)
Neutrophils Relative %: 55 %
Platelets: 248 10*3/uL (ref 140–400)
RBC: 3.86 10*6/uL (ref 3.80–5.10)
RDW: 12.6 % (ref 11.0–15.0)
Total Lymphocyte: 36.3 %
WBC: 6.8 10*3/uL (ref 3.8–10.8)

## 2019-11-11 LAB — COMPLETE METABOLIC PANEL WITH GFR
AG Ratio: 1.5 (calc) (ref 1.0–2.5)
ALT: 8 U/L (ref 6–29)
AST: 14 U/L (ref 10–35)
Albumin: 4.1 g/dL (ref 3.6–5.1)
Alkaline phosphatase (APISO): 68 U/L (ref 37–153)
BUN: 12 mg/dL (ref 7–25)
CO2: 31 mmol/L (ref 20–32)
Calcium: 9.3 mg/dL (ref 8.6–10.4)
Chloride: 102 mmol/L (ref 98–110)
Creat: 0.82 mg/dL (ref 0.50–0.99)
GFR, Est African American: 86 mL/min/{1.73_m2} (ref >=60)
GFR, Est Non African American: 75 mL/min/{1.73_m2} (ref >=60)
Globulin: 2.7 g/dL (calc) (ref 1.9–3.7)
Glucose, Bld: 100 mg/dL — ABNORMAL HIGH (ref 65–99)
Potassium: 3.3 mmol/L — ABNORMAL LOW (ref 3.5–5.3)
Sodium: 142 mmol/L (ref 135–146)
Total Bilirubin: 0.4 mg/dL (ref 0.2–1.2)
Total Protein: 6.8 g/dL (ref 6.1–8.1)

## 2019-11-11 LAB — HIV-1 RNA QUANT-NO REFLEX-BLD
HIV 1 RNA Quant: <20 copies/mL
HIV-1 RNA Quant, Log: <1.3 Log copies/mL

## 2019-11-11 LAB — RPR: RPR Ser Ql: NONREACTIVE

## 2020-02-01 ENCOUNTER — Other Ambulatory Visit: Payer: Self-pay

## 2020-02-01 ENCOUNTER — Ambulatory Visit: Payer: Medicare Other

## 2020-02-02 ENCOUNTER — Encounter: Payer: Self-pay | Admitting: Internal Medicine

## 2020-02-27 ENCOUNTER — Other Ambulatory Visit: Payer: Self-pay

## 2020-02-27 ENCOUNTER — Ambulatory Visit (INDEPENDENT_AMBULATORY_CARE_PROVIDER_SITE_OTHER): Payer: Medicare Other | Admitting: Internal Medicine

## 2020-02-27 ENCOUNTER — Encounter: Payer: Self-pay | Admitting: Internal Medicine

## 2020-02-27 VITALS — BP 131/88 | HR 57 | Temp 98.1°F | Wt 186.0 lb

## 2020-02-27 DIAGNOSIS — R635 Abnormal weight gain: Secondary | ICD-10-CM | POA: Diagnosis not present

## 2020-02-27 DIAGNOSIS — Z79899 Other long term (current) drug therapy: Secondary | ICD-10-CM | POA: Diagnosis not present

## 2020-02-27 DIAGNOSIS — I10 Essential (primary) hypertension: Secondary | ICD-10-CM | POA: Diagnosis not present

## 2020-02-27 DIAGNOSIS — B20 Human immunodeficiency virus [HIV] disease: Secondary | ICD-10-CM | POA: Diagnosis present

## 2020-02-27 NOTE — Progress Notes (Signed)
Rfv: hiv disease follow up  Patient ID: Tamara Lucas, female   DOB: Jun 19, 1956, 64 y.o.   MRN: [id number]  HPI 64yo F  With hiv disease, on biktarvy which she takes regularly. She is doing well overall with her health. She is not interested in getting the covid vaccine despite having close friend/family with the disease. Still vaccine hesistant. She keeps to herself thus has perceived low risk.   Outpatient Encounter Medications as of 02/27/2020  Medication Sig  . acetaminophen (TYLENOL) 325 MG tablet Take 650 mg by mouth every 6 (six) hours as needed for mild pain, moderate pain, fever or headache.   . bictegravir-emtricitabine-tenofovir AF (BIKTARVY) 50-200-25 MG TABS tablet Take 1 tablet by mouth daily.  Marland Kitchen levothyroxine (SYNTHROID, LEVOTHROID) 75 MCG tablet Take 75 mcg by mouth daily before breakfast.   . losartan-hydrochlorothiazide (HYZAAR) 50-12.5 MG tablet Take 1 tablet by mouth daily.   No facility-administered encounter medications on file as of 02/27/2020.     Patient Active Problem List   Diagnosis Date Noted  . HIV DISEASE 08/28/2006  . HYPOTHYROIDISM 08/28/2006  . HYPERTENSION 08/28/2006     Health Maintenance Due  Topic Date Due  . COVID-19 Vaccine (1) Never done  . TETANUS/TDAP  Never done  . DEXA SCAN  Never done  . PNA vac Low Risk Adult (2 of 2 - PPSV23) 04/13/2019  . INFLUENZA VACCINE  01/29/2020     Review of Systems Review of Systems  Constitutional: Negative for fever, chills, diaphoresis, activity change, appetite change, fatigue and unexpected weight change.  HENT: Negative for congestion, sore throat, rhinorrhea, sneezing, trouble swallowing and sinus pressure.  Eyes: Negative for photophobia and visual disturbance.  Respiratory: Negative for cough, chest tightness, shortness of breath, wheezing and stridor.  Cardiovascular: Negative for chest pain, palpitations and leg swelling.  Gastrointestinal: Negative for nausea, vomiting, abdominal pain,  diarrhea, constipation, blood in stool, abdominal distention and anal bleeding.  Genitourinary: Negative for dysuria, hematuria, flank pain and difficulty urinating.  Musculoskeletal: Negative for myalgias, back pain, joint swelling, arthralgias and gait problem.  Skin: Negative for color change, pallor, rash and wound.  Neurological: Negative for dizziness, tremors, weakness and light-headedness.  Hematological: Negative for adenopathy. Does not bruise/bleed easily.  Psychiatric/Behavioral: Negative for behavioral problems, confusion, sleep disturbance, dysphoric mood, decreased concentration and agitation.   Social History   Tobacco Use  . Smoking status: Former Smoker    Types: Cigarettes  . Smokeless tobacco: Never Used  Vaping Use  . Vaping Use: Never used  Substance Use Topics  . Alcohol use: No    Comment: occassional  . Drug use: No   Physical Exam   BP 131/88   Pulse (!) 57   Temp 98.1 F (36.7 C)   Wt 186 lb (84.4 kg)   SpO2 99%   BMI 36.33 kg/m   Physical Exam  Constitutional:  oriented to person, place, and time. appears well-developed and well-nourished. No distress.  HENT: [place]/AT, PERRLA, no scleral icterus Mouth/Throat: Oropharynx is clear and moist. No oropharyngeal exudate.  Cardiovascular: Normal rate, regular rhythm and normal heart sounds. Exam reveals no gallop and no friction rub.  No murmur heard.  Pulmonary/Chest: Effort normal and breath sounds normal. No respiratory distress.  has no wheezes.  Neck = supple, no nuchal rigidity Abdominal: Soft. Bowel sounds are normal.  exhibits no distension. There is no tenderness.  Lymphadenopathy: no cervical adenopathy. No axillary adenopathy Neurological: alert and oriented to person, place, and time.  Skin: Skin is warm and dry. No rash noted. No erythema.  Psychiatric: a normal mood and affect.  behavior is normal.   Lab Results  Component Value Date   CD4TCELL 46 11/09/2019   Lab Results  Component  Value Date   CD4TABS 1,192 11/09/2019   CD4TABS 1,242 05/11/2019   CD4TABS 1,410 08/02/2018   Lab Results  Component Value Date   HIV1RNAQUANT <20 NOT DETECTED 11/09/2019   Lab Results  Component Value Date   HEPBSAB No 08/24/2006   Lab Results  Component Value Date   LABRPR NON-REACTIVE 11/09/2019    CBC Lab Results  Component Value Date   WBC 6.8 11/09/2019   RBC 3.86 11/09/2019   HGB 12.9 11/09/2019   HCT 37.9 11/09/2019   PLT 248 11/09/2019   MCV 98.2 11/09/2019   MCH 33.4 (H) 11/09/2019   MCHC 34.0 11/09/2019   RDW 12.6 11/09/2019   LYMPHSABS 2,468 11/09/2019   MONOABS 0.5 02/15/2018   EOSABS 41 11/09/2019    BMET Lab Results  Component Value Date   NA 142 11/09/2019   K 3.3 (L) 11/09/2019   CL 102 11/09/2019   CO2 31 11/09/2019   GLUCOSE 100 (H) 11/09/2019   BUN 12 11/09/2019   CREATININE 0.82 11/09/2019   CALCIUM 9.3 11/09/2019   GFRNONAA 75 11/09/2019   GFRAA 86 11/09/2019      Assessment and Plan  hiv disease= well controlled. Continue on her current regimen  Long term medication management = cr is stable but will recheck labs  Overweight = since being retired, need to ensure she continues with physical activity and possible better dietary intake if still gaining weight.  htn = well controlled. Would not change her regimen at this time.

## 2020-03-26 ENCOUNTER — Other Ambulatory Visit: Payer: Self-pay | Admitting: *Deleted

## 2020-03-26 DIAGNOSIS — B20 Human immunodeficiency virus [HIV] disease: Secondary | ICD-10-CM

## 2020-03-26 MED ORDER — BIKTARVY 50-200-25 MG PO TABS: 1.0000 | ORAL_TABLET | Freq: Every day | ORAL | 5 refills | Status: DC

## 2020-03-27 ENCOUNTER — Other Ambulatory Visit: Payer: Self-pay | Admitting: Internal Medicine

## 2020-03-27 DIAGNOSIS — B20 Human immunodeficiency virus [HIV] disease: Secondary | ICD-10-CM

## 2020-07-05 ENCOUNTER — Ambulatory Visit: Payer: Medicare Other

## 2020-08-24 ENCOUNTER — Telehealth: Payer: Self-pay

## 2020-08-24 NOTE — Telephone Encounter (Signed)
Received call from patient requesting refill reminders for her Biktarvy. RN explained that the pharmacy is the one that calls her regarding reminders. Patient then states that she just picked up her last refill of Biktarvy and wants to make sure that her medication stays on schedule. RN advised her that she has an appointment with Dr. Drue Second on 09/11/20 and that her last refill should get her to that appointment, during which additional refills will be prescribed. Advised patient that if she needs to reschedule her appointment for any reason to please let us know so that we can ensure she continues to receive her medication on time. Patient verbalized understanding and has no further questions.   Sandie Ano, RN

## 2020-08-27 ENCOUNTER — Other Ambulatory Visit: Payer: Self-pay

## 2020-08-27 ENCOUNTER — Other Ambulatory Visit: Payer: PPO

## 2020-08-27 DIAGNOSIS — B20 Human immunodeficiency virus [HIV] disease: Secondary | ICD-10-CM | POA: Diagnosis not present

## 2020-08-28 LAB — T-HELPER CELL (CD4) - (RCID CLINIC ONLY)
CD4 % Helper T Cell: 41 % (ref 33–65)
CD4 T Cell Abs: 1105 /uL (ref 400–1790)

## 2020-08-29 LAB — COMPREHENSIVE METABOLIC PANEL
AG Ratio: 1.6 (calc) (ref 1.0–2.5)
ALT: 8 U/L (ref 6–29)
AST: 14 U/L (ref 10–35)
Albumin: 4.3 g/dL (ref 3.6–5.1)
Alkaline phosphatase (APISO): 66 U/L (ref 37–153)
BUN: 20 mg/dL (ref 7–25)
CO2: 29 mmol/L (ref 20–32)
Calcium: 9.4 mg/dL (ref 8.6–10.4)
Chloride: 102 mmol/L (ref 98–110)
Creat: 0.85 mg/dL (ref 0.50–0.99)
Globulin: 2.7 g/dL (calc) (ref 1.9–3.7)
Glucose, Bld: 129 mg/dL — ABNORMAL HIGH (ref 65–99)
Potassium: 3.6 mmol/L (ref 3.5–5.3)
Sodium: 140 mmol/L (ref 135–146)
Total Bilirubin: 0.5 mg/dL (ref 0.2–1.2)
Total Protein: 7 g/dL (ref 6.1–8.1)

## 2020-08-29 LAB — CBC
HCT: 38.9 % (ref 35.0–45.0)
Hemoglobin: 13.4 g/dL (ref 11.7–15.5)
MCH: 33.2 pg — ABNORMAL HIGH (ref 27.0–33.0)
MCHC: 34.4 g/dL (ref 32.0–36.0)
MCV: 96.3 fL (ref 80.0–100.0)
MPV: 11.1 fL (ref 7.5–12.5)
Platelets: 243 10*3/uL (ref 140–400)
RBC: 4.04 10*6/uL (ref 3.80–5.10)
RDW: 13.2 % (ref 11.0–15.0)
WBC: 6.8 10*3/uL (ref 3.8–10.8)

## 2020-08-29 LAB — HIV-1 RNA QUANT-NO REFLEX-BLD
HIV 1 RNA Quant: NOT DETECTED Copies/mL
HIV-1 RNA Quant, Log: NOT DETECTED Log cps/mL

## 2020-08-29 LAB — RPR: RPR Ser Ql: NONREACTIVE

## 2020-09-10 ENCOUNTER — Encounter: Payer: Self-pay | Admitting: Internal Medicine

## 2020-09-10 ENCOUNTER — Other Ambulatory Visit: Payer: Self-pay

## 2020-09-10 ENCOUNTER — Ambulatory Visit: Payer: PPO | Admitting: Internal Medicine

## 2020-09-10 VITALS — BP 153/90 | HR 67 | Wt 188.8 lb

## 2020-09-10 DIAGNOSIS — Z79899 Other long term (current) drug therapy: Secondary | ICD-10-CM

## 2020-09-10 DIAGNOSIS — R635 Abnormal weight gain: Secondary | ICD-10-CM | POA: Diagnosis not present

## 2020-09-10 DIAGNOSIS — I10 Essential (primary) hypertension: Secondary | ICD-10-CM

## 2020-09-10 DIAGNOSIS — B20 Human immunodeficiency virus [HIV] disease: Secondary | ICD-10-CM | POA: Diagnosis not present

## 2020-09-10 MED ORDER — BIKTARVY 50-200-25 MG PO TABS: 1.0000 | ORAL_TABLET | Freq: Every day | ORAL | 11 refills | Status: DC

## 2020-09-10 NOTE — Progress Notes (Signed)
RFV: follow up for hiv disease  Patient ID: Tamara Lucas, female   DOB: [birth year], 65 y.o.   MRN: [id number]  HPI  65yo F with hiv disease, Cd 4 count 1105/VL< 20, on biktarvy.also has hx of htn, hypothyroidism. She reports that she is Having financial dispute with landlord causing her to have high blood pressure this morning. "never been late for bills". Besides this stressor, she denies any issues/complaints with her health.  Sees  Dr polite in June  Has not covid vaccine  Working 3 days on the weekend, still considered herself as retired. Outpatient Encounter Medications as of 09/10/2020  Medication Sig  . bictegravir-emtricitabine-tenofovir AF (BIKTARVY) 50-200-25 MG TABS tablet Take 1 tablet by mouth daily.  Marland Kitchen levothyroxine (SYNTHROID, LEVOTHROID) 75 MCG tablet Take 75 mcg by mouth daily before breakfast.   . losartan-hydrochlorothiazide (HYZAAR) 50-12.5 MG tablet Take 1 tablet by mouth daily.  Marland Kitchen acetaminophen (TYLENOL) 325 MG tablet Take 650 mg by mouth every 6 (six) hours as needed for mild pain, moderate pain, fever or headache.    No facility-administered encounter medications on file as of 09/10/2020.     Patient Active Problem List   Diagnosis Date Noted  . HIV DISEASE 08/28/2006  . HYPOTHYROIDISM 08/28/2006  . HYPERTENSION 08/28/2006     Health Maintenance Due  Topic Date Due  . COVID-19 Vaccine (1) Never done  . DEXA SCAN  Never done  . INFLUENZA VACCINE  01/29/2020    Soc hx: no smoking or drinking  Review of Systems Review of Systems  Constitutional: Negative for fever, chills, diaphoresis, activity change, appetite change, fatigue and unexpected weight change.  HENT: Negative for congestion, sore throat, rhinorrhea, sneezing, trouble swallowing and sinus pressure.  Eyes: Negative for photophobia and visual disturbance.  Respiratory: Negative for cough, chest tightness, shortness of breath, wheezing and stridor.  Cardiovascular: Negative for chest  pain, palpitations and leg swelling.  Gastrointestinal: Negative for nausea, vomiting, abdominal pain, diarrhea, constipation, blood in stool, abdominal distention and anal bleeding.  Genitourinary: Negative for dysuria, hematuria, flank pain and difficulty urinating.  Musculoskeletal: Negative for myalgias, back pain, joint swelling, arthralgias and gait problem.  Skin: Negative for color change, pallor, rash and wound.  Neurological: Negative for dizziness, tremors, weakness and light-headedness.  Hematological: Negative for adenopathy. Does not bruise/bleed easily.  Psychiatric/Behavioral: +stress with landlord   Physical Exam   BP (!) 153/90   Pulse 67   Wt 188 lb 12.8 oz (85.6 kg)   BMI 36.87 kg/m   Physical Exam  Constitutional:  oriented to person, place, and time. appears well-developed and well-nourished. No distress.  HENT: [place]/AT, PERRLA, no scleral icterus Mouth/Throat: Oropharynx is clear and moist. No oropharyngeal exudate.  Cardiovascular: Normal rate, regular rhythm and normal heart sounds. Exam reveals no gallop and no friction rub.  No murmur heard.  Pulmonary/Chest: Effort normal and breath sounds normal. No respiratory distress.  has no wheezes.  Neck = supple, no nuchal rigidity Abdominal: Soft. Bowel sounds are normal.  exhibits no distension. There is no tenderness.  Lymphadenopathy: no cervical adenopathy. No axillary adenopathy Neurological: alert and oriented to person, place, and time.  Skin: Skin is warm and dry. No rash noted. No erythema.  Psychiatric: a normal mood and affect.  behavior is normal.   Lab Results  Component Value Date   CD4TCELL 41 08/27/2020   Lab Results  Component Value Date   CD4TABS 1,105 08/27/2020   CD4TABS 1,192 11/09/2019   CD4TABS 1,242 05/11/2019  Lab Results  Component Value Date   HIV1RNAQUANT Not Detected 08/27/2020   Lab Results  Component Value Date   HEPBSAB No 08/24/2006   Lab Results  Component Value  Date   LABRPR NON-REACTIVE 08/27/2020    CBC Lab Results  Component Value Date   WBC 6.8 08/27/2020   RBC 4.04 08/27/2020   HGB 13.4 08/27/2020   HCT 38.9 08/27/2020   PLT 243 08/27/2020   MCV 96.3 08/27/2020   MCH 33.2 (H) 08/27/2020   MCHC 34.4 08/27/2020   RDW 13.2 08/27/2020   LYMPHSABS 2,468 11/09/2019   MONOABS 0.5 02/15/2018   EOSABS 41 11/09/2019    BMET Lab Results  Component Value Date   NA 140 08/27/2020   K 3.6 08/27/2020   CL 102 08/27/2020   CO2 29 08/27/2020   GLUCOSE 129 (H) 08/27/2020   BUN 20 08/27/2020   CREATININE 0.85 08/27/2020   CALCIUM 9.4 08/27/2020   GFRNONAA 75 11/09/2019   GFRAA 86 11/09/2019      Assessment and Plan  hiv disease = continue on biktarvy since well controlled. Will give refills  Long term meds management= cr stable. No need for any changes in her medications  Hypertension = poorly controlled at this visit. Will recommend less salt in diet; but also she has not had medicine this morning. Discussed to take meds before her next doctor appt with pcp to see if needs to be titrated up if BP still not at goal  Health maintenance = will need hep b s ab checked at next visit. Also will need flu vaccine. Will try to convince to get covid vaccine for the fall  Weight gain = mentioned that dietary modification and exercise will also help her HTN

## 2020-09-11 ENCOUNTER — Encounter: Payer: Medicare Other | Admitting: Internal Medicine

## 2020-09-13 ENCOUNTER — Other Ambulatory Visit: Payer: Self-pay | Admitting: Internal Medicine

## 2020-09-13 DIAGNOSIS — Z1231 Encounter for screening mammogram for malignant neoplasm of breast: Secondary | ICD-10-CM

## 2020-11-07 ENCOUNTER — Ambulatory Visit
Admission: RE | Admit: 2020-11-07 | Discharge: 2020-11-07 | Disposition: A | Discharge status: Home / Self Care | Payer: PPO | Source: Ambulatory Visit | Attending: Internal Medicine | Admitting: Internal Medicine

## 2020-11-07 ENCOUNTER — Other Ambulatory Visit: Payer: Self-pay

## 2020-11-07 DIAGNOSIS — Z1231 Encounter for screening mammogram for malignant neoplasm of breast: Secondary | ICD-10-CM

## 2021-01-10 ENCOUNTER — Ambulatory Visit: Payer: PPO

## 2021-01-10 ENCOUNTER — Other Ambulatory Visit: Payer: Self-pay

## 2021-01-11 ENCOUNTER — Encounter: Payer: Self-pay | Admitting: Internal Medicine

## 2021-02-01 DIAGNOSIS — E039 Hypothyroidism, unspecified: Secondary | ICD-10-CM | POA: Diagnosis not present

## 2021-02-01 DIAGNOSIS — Z0000 Encounter for general adult medical examination without abnormal findings: Secondary | ICD-10-CM | POA: Diagnosis not present

## 2021-02-01 DIAGNOSIS — R600 Localized edema: Secondary | ICD-10-CM | POA: Diagnosis not present

## 2021-02-01 DIAGNOSIS — E2839 Other primary ovarian failure: Secondary | ICD-10-CM | POA: Diagnosis not present

## 2021-02-01 DIAGNOSIS — B20 Human immunodeficiency virus [HIV] disease: Secondary | ICD-10-CM | POA: Diagnosis not present

## 2021-02-01 DIAGNOSIS — I5189 Other ill-defined heart diseases: Secondary | ICD-10-CM | POA: Diagnosis not present

## 2021-02-01 DIAGNOSIS — E7800 Pure hypercholesterolemia, unspecified: Secondary | ICD-10-CM | POA: Diagnosis not present

## 2021-02-01 DIAGNOSIS — I10 Essential (primary) hypertension: Secondary | ICD-10-CM | POA: Diagnosis not present

## 2021-02-05 ENCOUNTER — Other Ambulatory Visit: Payer: Self-pay | Admitting: Internal Medicine

## 2021-02-05 DIAGNOSIS — E2839 Other primary ovarian failure: Secondary | ICD-10-CM

## 2021-02-28 ENCOUNTER — Other Ambulatory Visit: Payer: PPO

## 2021-02-28 ENCOUNTER — Other Ambulatory Visit: Payer: Self-pay

## 2021-02-28 DIAGNOSIS — B20 Human immunodeficiency virus [HIV] disease: Secondary | ICD-10-CM | POA: Diagnosis not present

## 2021-03-01 ENCOUNTER — Other Ambulatory Visit: Payer: Self-pay | Admitting: Infectious Diseases

## 2021-03-01 LAB — T-HELPER CELL (CD4) - (RCID CLINIC ONLY)
CD4 % Helper T Cell: 39 % (ref 33–65)
CD4 T Cell Abs: 1363 /uL (ref 400–1790)

## 2021-03-01 MED ORDER — POTASSIUM CHLORIDE CRYS ER 20 MEQ PO TBCR
40.0000 meq | EXTENDED_RELEASE_TABLET | Freq: Once | ORAL | 0 refills | Status: DC
Start: 2021-03-01 — End: 2021-10-21

## 2021-03-01 NOTE — Progress Notes (Signed)
I called Tamara Lucas this morning about her K being 2.7. I offered her to prescribe one dose of KCL. She had questions regarding payment and preferred Dr Drue Second to be informed about this and contact her if necessary for the prescriptions. She told me she has not been eating well lately and reason why her k is low. I discussed with her about importance of correcting her potassium given it can cause cardiac abnormalities if remains low. I have staff messaged Dr Drue Second about this.

## 2021-03-02 ENCOUNTER — Other Ambulatory Visit: Payer: Self-pay | Admitting: Infectious Diseases

## 2021-03-03 ENCOUNTER — Other Ambulatory Visit: Payer: Self-pay | Admitting: Infectious Diseases

## 2021-03-03 LAB — LIPID PANEL
Cholesterol: 211 mg/dL — ABNORMAL HIGH (ref <200)
HDL: 64 mg/dL (ref >=50)
LDL Cholesterol (Calc): 126 mg/dL (calc) — ABNORMAL HIGH
Non-HDL Cholesterol (Calc): 147 mg/dL (calc) — ABNORMAL HIGH (ref <130)
Total CHOL/HDL Ratio: 3.3 (calc) (ref <5.0)
Triglycerides: 103 mg/dL (ref <150)

## 2021-03-03 LAB — BASIC METABOLIC PANEL
BUN: 19 mg/dL (ref 7–25)
CO2: 29 mmol/L (ref 20–32)
Calcium: 9.2 mg/dL (ref 8.6–10.4)
Chloride: 99 mmol/L (ref 98–110)
Creat: 0.97 mg/dL (ref 0.50–1.05)
Glucose, Bld: 104 mg/dL — ABNORMAL HIGH (ref 65–99)
Potassium: 2.7 mmol/L — CL (ref 3.5–5.3)
Sodium: 142 mmol/L (ref 135–146)

## 2021-03-03 LAB — CBC WITH DIFFERENTIAL/PLATELET
Absolute Monocytes: 653 cells/uL (ref 200–950)
Basophils Absolute: 52 cells/uL (ref 0–200)
Basophils Relative: 0.6 %
Eosinophils Absolute: 17 cells/uL (ref 15–500)
Eosinophils Relative: 0.2 %
HCT: 39.8 % (ref 35.0–45.0)
Hemoglobin: 13.7 g/dL (ref 11.7–15.5)
Lymphs Abs: 3637 cells/uL (ref 850–3900)
MCH: 33.8 pg — ABNORMAL HIGH (ref 27.0–33.0)
MCHC: 34.4 g/dL (ref 32.0–36.0)
MCV: 98.3 fL (ref 80.0–100.0)
MPV: 10.9 fL (ref 7.5–12.5)
Monocytes Relative: 7.5 %
Neutro Abs: 4341 cells/uL (ref 1500–7800)
Neutrophils Relative %: 49.9 %
Platelets: 238 10*3/uL (ref 140–400)
RBC: 4.05 10*6/uL (ref 3.80–5.10)
RDW: 12.6 % (ref 11.0–15.0)
Total Lymphocyte: 41.8 %
WBC: 8.7 10*3/uL (ref 3.8–10.8)

## 2021-03-03 LAB — HIV-1 RNA QUANT-NO REFLEX-BLD
HIV 1 RNA Quant: NOT DETECTED Copies/mL
HIV-1 RNA Quant, Log: NOT DETECTED Log cps/mL

## 2021-03-03 LAB — HEPATITIS B SURFACE ANTIBODY,QUALITATIVE: Hep B S Ab: NONREACTIVE

## 2021-03-04 ENCOUNTER — Other Ambulatory Visit: Payer: Self-pay | Admitting: Infectious Diseases

## 2021-03-05 ENCOUNTER — Other Ambulatory Visit: Payer: Self-pay | Admitting: Infectious Diseases

## 2021-03-07 ENCOUNTER — Other Ambulatory Visit: Payer: PPO

## 2021-03-07 ENCOUNTER — Other Ambulatory Visit: Payer: Self-pay

## 2021-03-07 ENCOUNTER — Other Ambulatory Visit: Payer: Self-pay | Admitting: Internal Medicine

## 2021-03-07 DIAGNOSIS — E876 Hypokalemia: Secondary | ICD-10-CM

## 2021-03-08 LAB — BASIC METABOLIC PANEL
BUN: 14 mg/dL (ref 7–25)
CO2: 30 mmol/L (ref 20–32)
Calcium: 9.6 mg/dL (ref 8.6–10.4)
Chloride: 99 mmol/L (ref 98–110)
Creat: 0.92 mg/dL (ref 0.50–1.05)
Glucose, Bld: 123 mg/dL — ABNORMAL HIGH (ref 65–99)
Potassium: 3.1 mmol/L — ABNORMAL LOW (ref 3.5–5.3)
Sodium: 140 mmol/L (ref 135–146)

## 2021-03-08 LAB — MAGNESIUM: Magnesium: 2 mg/dL (ref 1.5–2.5)

## 2021-03-09 ENCOUNTER — Telehealth: Payer: Self-pay | Admitting: Internal Medicine

## 2021-03-09 MED ORDER — POTASSIUM CHLORIDE CRYS ER 10 MEQ PO TBCR: 20.0000 meq | EXTENDED_RELEASE_TABLET | Freq: Every day | ORAL | 0 refills | Status: DC

## 2021-03-09 NOTE — Telephone Encounter (Signed)
Potassium on BMP is still low. At 3.1. will do potassium daily x 5 days. Then have her start taking banana a day. Have her follow up with dr polite in 7-14 days

## 2021-03-11 ENCOUNTER — Other Ambulatory Visit: Payer: Self-pay

## 2021-03-11 ENCOUNTER — Other Ambulatory Visit: Payer: Self-pay | Admitting: Internal Medicine

## 2021-03-11 ENCOUNTER — Telehealth: Payer: Self-pay

## 2021-03-11 NOTE — Telephone Encounter (Signed)
Patient informed. 

## 2021-03-11 NOTE — Telephone Encounter (Signed)
Patient called and wanted to let you know that she was started on lasix a prior to having her labs done with our office. She is wanting to know could this have affected her potassium.

## 2021-03-11 NOTE — Telephone Encounter (Signed)
Does patient need to continue? 

## 2021-03-18 ENCOUNTER — Encounter: Payer: PPO | Admitting: Internal Medicine

## 2021-04-08 ENCOUNTER — Other Ambulatory Visit: Payer: Self-pay

## 2021-04-08 ENCOUNTER — Ambulatory Visit: Payer: PPO | Admitting: Internal Medicine

## 2021-04-08 ENCOUNTER — Encounter: Payer: Self-pay | Admitting: Internal Medicine

## 2021-04-08 VITALS — BP 123/76 | HR 60 | Temp 98.2°F | Ht 60.0 in | Wt 180.0 lb

## 2021-04-08 DIAGNOSIS — B20 Human immunodeficiency virus [HIV] disease: Secondary | ICD-10-CM | POA: Diagnosis not present

## 2021-04-08 DIAGNOSIS — E876 Hypokalemia: Secondary | ICD-10-CM

## 2021-04-08 DIAGNOSIS — Z79899 Other long term (current) drug therapy: Secondary | ICD-10-CM | POA: Diagnosis not present

## 2021-04-08 DIAGNOSIS — Z23 Encounter for immunization: Secondary | ICD-10-CM | POA: Diagnosis not present

## 2021-04-08 NOTE — Progress Notes (Signed)
RFV: follow up for HIV disease  Patient ID: Tamara Lucas, female   DOB: 1955/07/13, 65 y.o.   MRN: [id number]  HPI Tamara Lucas is a 65yo F with well controlled HIV disease, she has excellet adherence. 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  Not missing a dose. Moves to a single level place (no longer needs to go upstairs) Loss 10 lb since starting diuretics (given by dr polite)  She reports her health being good overall  Outpatient Encounter Medications as of 04/08/2021  Medication Sig   acetaminophen (TYLENOL) 325 MG tablet Take 650 mg by mouth every 6 (six) hours as needed for mild pain, moderate pain, fever or headache.    bictegravir-emtricitabine-tenofovir AF (BIKTARVY) 50-200-25 MG TABS tablet Take 1 tablet by mouth daily.   furosemide (LASIX) 20 MG tablet Take 20 mg by mouth daily.   levothyroxine (SYNTHROID, LEVOTHROID) 75 MCG tablet Take 75 mcg by mouth daily before breakfast.    losartan-hydrochlorothiazide (HYZAAR) 50-12.5 MG tablet Take 1 tablet by mouth daily.   potassium chloride (KLOR-CON) 10 MEQ tablet Take 2 tablets (20 mEq total) by mouth daily for 5 days.   potassium chloride SA (KLOR-CON) 20 MEQ tablet Take 2 tablets (40 mEq total) by mouth once for 1 dose.   No facility-administered encounter medications on file as of 04/08/2021.     Patient Active Problem List   Diagnosis Date Noted   HIV DISEASE 08/28/2006   HYPOTHYROIDISM 08/28/2006   HYPERTENSION 08/28/2006     Health Maintenance Due  Topic Date Due   COVID-19 Vaccine (1) Never done   TETANUS/TDAP  Never done   Zoster Vaccines- Shingrix (1 of 2) Never done   DEXA SCAN  Never done   COLONOSCOPY (Pts 45-12yrs Insurance coverage will need to be confirmed)  09/25/2020   INFLUENZA VACCINE  01/28/2021     Review of Systems Review of Systems  Constitutional: Negative for fever, chills, diaphoresis, activity change, appetite change, fatigue and unexpected weight change.  HENT: Negative  for congestion, sore throat, rhinorrhea, sneezing, trouble swallowing and sinus pressure.  Eyes: Negative for photophobia and visual disturbance.  Respiratory: Negative for cough, chest tightness, shortness of breath, wheezing and stridor.  Cardiovascular: Negative for chest pain, palpitations and leg swelling.  Gastrointestinal: Negative for nausea, vomiting, abdominal pain, diarrhea, constipation, blood in stool, abdominal distention and anal bleeding.  Genitourinary: Negative for dysuria, hematuria, flank pain and difficulty urinating.  Musculoskeletal: Negative for myalgias, back pain, joint swelling, arthralgias and gait problem.  Skin: Negative for color change, pallor, rash and wound.  Neurological: Negative for dizziness, tremors, weakness and light-headedness.  Hematological: Negative for adenopathy. Does not bruise/bleed easily.  Psychiatric/Behavioral: Negative for behavioral problems, confusion, sleep disturbance, dysphoric mood, decreased concentration and agitation.   Physical Exam   BP 123/76   Pulse 60   Temp 98.2 F (36.8 C) (Oral)   Ht 5' (1.524 m)   Wt 180 lb (81.6 kg)   SpO2 99%   BMI 35.15 kg/m  Physical Exam  Constitutional:  oriented to person, place, and time. appears well-developed and well-nourished. No distress.  HENT: [place]/AT, PERRLA, no scleral icterus Mouth/Throat: Oropharynx is clear and moist. No oropharyngeal exudate.  Cardiovascular: Normal rate, regular rhythm and normal heart sounds. Exam reveals no gallop and no friction rub.  No murmur heard.  Pulmonary/Chest: Effort normal and breath sounds normal. No respiratory distress.  has no wheezes.  Neck = supple, no nuchal rigidity  Lymphadenopathy: no  cervical adenopathy. No axillary adenopathy Neurological: alert and oriented to person, place, and time.  Skin: Skin is warm and dry. No rash noted. No erythema.  Psychiatric: a normal mood and affect.  behavior is normal.   Lab Results  Component Value  Date   CD4TCELL 39 02/28/2021   Lab Results  Component Value Date   CD4TABS 1,363 02/28/2021   CD4TABS 1,105 08/27/2020   CD4TABS 1,192 11/09/2019   Lab Results  Component Value Date   HIV1RNAQUANT Not Detected 02/28/2021   Lab Results  Component Value Date   HEPBSAB NON-REACTIVE 02/28/2021   Lab Results  Component Value Date   LABRPR NON-REACTIVE 08/27/2020    CBC Lab Results  Component Value Date   WBC 8.7 02/28/2021   RBC 4.05 02/28/2021   HGB 13.7 02/28/2021   HCT 39.8 02/28/2021   PLT 238 02/28/2021   MCV 98.3 02/28/2021   MCH 33.8 (H) 02/28/2021   MCHC 34.4 02/28/2021   RDW 12.6 02/28/2021   LYMPHSABS 3,637 02/28/2021   MONOABS 0.5 02/15/2018   EOSABS 17 02/28/2021    BMET Lab Results  Component Value Date   NA 140 03/07/2021   K 3.1 (L) 03/07/2021   CL 99 03/07/2021   CO2 30 03/07/2021   GLUCOSE 123 (H) 03/07/2021   BUN 14 03/07/2021   CREATININE 0.92 03/07/2021   CALCIUM 9.6 03/07/2021   GFRNONAA 75 11/09/2019   GFRAA 86 11/09/2019      Assessment and Plan  Hypokalemia = from diuretics but not currently on potassium  Hiv disease= well controlled, continue on current regimen  Long term medication management = cr is table  Health maitenance = will give flu vaccine.  Not interested in covid booster

## 2021-04-09 LAB — BASIC METABOLIC PANEL
BUN: 12 mg/dL (ref 7–25)
CO2: 33 mmol/L — ABNORMAL HIGH (ref 20–32)
Calcium: 9.9 mg/dL (ref 8.6–10.4)
Chloride: 101 mmol/L (ref 98–110)
Creat: 0.81 mg/dL (ref 0.50–1.05)
Glucose, Bld: 100 mg/dL — ABNORMAL HIGH (ref 65–99)
Potassium: 3.2 mmol/L — ABNORMAL LOW (ref 3.5–5.3)
Sodium: 142 mmol/L (ref 135–146)

## 2021-04-09 LAB — MAGNESIUM: Magnesium: 2 mg/dL (ref 1.5–2.5)

## 2021-08-05 ENCOUNTER — Other Ambulatory Visit: Payer: Self-pay

## 2021-08-05 ENCOUNTER — Ambulatory Visit
Admission: RE | Admit: 2021-08-05 | Discharge: 2021-08-05 | Disposition: A | Discharge status: Home / Self Care | Payer: PPO | Source: Ambulatory Visit | Attending: Internal Medicine | Admitting: Internal Medicine

## 2021-08-05 ENCOUNTER — Other Ambulatory Visit: Payer: PPO

## 2021-08-05 ENCOUNTER — Other Ambulatory Visit: Payer: Self-pay | Admitting: Internal Medicine

## 2021-08-05 DIAGNOSIS — E2839 Other primary ovarian failure: Secondary | ICD-10-CM

## 2021-08-05 DIAGNOSIS — Z1231 Encounter for screening mammogram for malignant neoplasm of breast: Secondary | ICD-10-CM

## 2021-08-05 DIAGNOSIS — M8588 Other specified disorders of bone density and structure, other site: Secondary | ICD-10-CM | POA: Diagnosis not present

## 2021-08-05 DIAGNOSIS — Z780 Asymptomatic menopausal state: Secondary | ICD-10-CM | POA: Diagnosis not present

## 2021-08-17 ENCOUNTER — Other Ambulatory Visit: Payer: Self-pay | Admitting: Internal Medicine

## 2021-08-17 DIAGNOSIS — B20 Human immunodeficiency virus [HIV] disease: Secondary | ICD-10-CM

## 2021-10-07 ENCOUNTER — Other Ambulatory Visit: Payer: PPO

## 2021-10-07 ENCOUNTER — Other Ambulatory Visit: Payer: Self-pay

## 2021-10-07 DIAGNOSIS — Z113 Encounter for screening for infections with a predominantly sexual mode of transmission: Secondary | ICD-10-CM

## 2021-10-07 DIAGNOSIS — B20 Human immunodeficiency virus [HIV] disease: Secondary | ICD-10-CM | POA: Diagnosis not present

## 2021-10-08 LAB — T-HELPER CELL (CD4) - (RCID CLINIC ONLY)
CD4 % Helper T Cell: 39 % (ref 33–65)
CD4 T Cell Abs: 1309 /uL (ref 400–1790)

## 2021-10-09 LAB — CBC WITH DIFFERENTIAL/PLATELET
Absolute Monocytes: 510 cells/uL (ref 200–950)
Basophils Absolute: 30 cells/uL (ref 0–200)
Basophils Relative: 0.4 %
Eosinophils Absolute: 68 cells/uL (ref 15–500)
Eosinophils Relative: 0.9 %
HCT: 39.5 % (ref 35.0–45.0)
Hemoglobin: 13.4 g/dL (ref 11.7–15.5)
Lymphs Abs: 3540 cells/uL (ref 850–3900)
MCH: 33 pg (ref 27.0–33.0)
MCHC: 33.9 g/dL (ref 32.0–36.0)
MCV: 97.3 fL (ref 80.0–100.0)
MPV: 11.2 fL (ref 7.5–12.5)
Monocytes Relative: 6.8 %
Neutro Abs: 3353 cells/uL (ref 1500–7800)
Neutrophils Relative %: 44.7 %
Platelets: 224 10*3/uL (ref 140–400)
RBC: 4.06 10*6/uL (ref 3.80–5.10)
RDW: 12.5 % (ref 11.0–15.0)
Total Lymphocyte: 47.2 %
WBC: 7.5 10*3/uL (ref 3.8–10.8)

## 2021-10-09 LAB — COMPLETE METABOLIC PANEL WITH GFR
AG Ratio: 1.4 (calc) (ref 1.0–2.5)
ALT: 10 U/L (ref 6–29)
AST: 16 U/L (ref 10–35)
Albumin: 4.3 g/dL (ref 3.6–5.1)
Alkaline phosphatase (APISO): 61 U/L (ref 37–153)
BUN: 13 mg/dL (ref 7–25)
CO2: 33 mmol/L — ABNORMAL HIGH (ref 20–32)
Calcium: 9.9 mg/dL (ref 8.6–10.4)
Chloride: 102 mmol/L (ref 98–110)
Creat: 0.89 mg/dL (ref 0.50–1.05)
Globulin: 3.1 g/dL (calc) (ref 1.9–3.7)
Glucose, Bld: 106 mg/dL — ABNORMAL HIGH (ref 65–99)
Potassium: 3.5 mmol/L (ref 3.5–5.3)
Sodium: 142 mmol/L (ref 135–146)
Total Bilirubin: 0.5 mg/dL (ref 0.2–1.2)
Total Protein: 7.4 g/dL (ref 6.1–8.1)
eGFR: 71 mL/min/{1.73_m2} (ref >=60)

## 2021-10-09 LAB — HIV-1 RNA QUANT-NO REFLEX-BLD
HIV 1 RNA Quant: NOT DETECTED copies/mL
HIV-1 RNA Quant, Log: NOT DETECTED Log copies/mL

## 2021-10-09 LAB — RPR: RPR Ser Ql: NONREACTIVE

## 2021-10-21 ENCOUNTER — Ambulatory Visit: Payer: PPO | Admitting: Internal Medicine

## 2021-10-21 ENCOUNTER — Other Ambulatory Visit: Payer: Self-pay

## 2021-10-21 ENCOUNTER — Encounter: Payer: Self-pay | Admitting: Internal Medicine

## 2021-10-21 VITALS — BP 128/79 | HR 71 | Temp 98.0°F | Ht 60.0 in | Wt 194.0 lb

## 2021-10-21 DIAGNOSIS — I10 Essential (primary) hypertension: Secondary | ICD-10-CM | POA: Diagnosis not present

## 2021-10-21 DIAGNOSIS — M8580 Other specified disorders of bone density and structure, unspecified site: Secondary | ICD-10-CM

## 2021-10-21 DIAGNOSIS — Z79899 Other long term (current) drug therapy: Secondary | ICD-10-CM | POA: Diagnosis not present

## 2021-10-21 DIAGNOSIS — B20 Human immunodeficiency virus [HIV] disease: Secondary | ICD-10-CM | POA: Diagnosis not present

## 2021-10-21 MED ORDER — BIKTARVY 50-200-25 MG PO TABS: 1.0000 | ORAL_TABLET | Freq: Every day | ORAL | 11 refills | Status: DC

## 2021-10-21 MED ORDER — CALTRATE 600+D PLUS MINERALS 600-800 MG-UNIT PO TABS: 1.0000 | ORAL_TABLET | Freq: Every day | ORAL | 3 refills | Status: AC

## 2021-10-21 NOTE — Progress Notes (Signed)
? ? ?Patient ID: Tamara Lucas, female   DOB: [birth year], 66 y.o.   MRN: [id number] ? ?HPI ?66yo F with well controlled hiv disease, hx of HTN, hypothyroidism, CHF. ?Getting involved with singing and church ?Retired completely now,since 6 months. Finances doing okay. ?Feb had bone scan showing osteopenia of L-spine- currently doesn't know what supplements to take. ? ?No recent illnesses ? ?She asked for coordination of blood draws with PCP, since she is a difficult venopuncture-had multiple sticks on her last lab visit ?Outpatient Encounter Medications as of 10/21/2021  ?Medication Sig  ? acetaminophen (TYLENOL) 325 MG tablet Take 650 mg by mouth every 6 (six) hours as needed for mild pain, moderate pain, fever or headache.   ? BIKTARVY 50-200-25 MG TABS tablet TAKE 1 TABLET BY MOUTH DAILY  ? levothyroxine (SYNTHROID, LEVOTHROID) 75 MCG tablet Take 75 mcg by mouth daily before breakfast.   ? losartan-hydrochlorothiazide (HYZAAR) 50-12.5 MG tablet Take 1 tablet by mouth daily.  ? furosemide (LASIX) 20 MG tablet Take 20 mg by mouth daily. (Patient not taking: Reported on 10/21/2021)  ? potassium chloride (KLOR-CON) 10 MEQ tablet Take 2 tablets (20 mEq total) by mouth daily for 5 days. (Patient not taking: Reported on 10/21/2021)  ? potassium chloride SA (KLOR-CON) 20 MEQ tablet Take 2 tablets (40 mEq total) by mouth once for 1 dose. (Patient not taking: Reported on 10/21/2021)  ? ?No facility-administered encounter medications on file as of 10/21/2021.  ?  ? ?Patient Active Problem List  ? Diagnosis Date Noted  ? HIV DISEASE 08/28/2006  ? HYPOTHYROIDISM 08/28/2006  ? HYPERTENSION 08/28/2006  ? ? ? ?Health Maintenance Due  ?Topic Date Due  ? COVID-19 Vaccine (1) Never done  ? Zoster Vaccines- Shingrix (1 of 2) Never done  ? COLONOSCOPY (Pts 45-73yrs Insurance coverage will need to be confirmed)  09/25/2020  ? TETANUS/TDAP  10/10/2020  ?  ? ?Review of Systems ?Review of Systems  ?Constitutional: Negative for fever,  chills, diaphoresis, activity change, appetite change, fatigue and unexpected weight change.  ?HENT: Negative for congestion, sore throat, rhinorrhea, sneezing, trouble swallowing and sinus pressure.  ?Eyes: Negative for photophobia and visual disturbance.  ?Respiratory: Negative for cough, chest tightness, shortness of breath, wheezing and stridor.  ?Cardiovascular: Negative for chest pain, palpitations and leg swelling.  ?Gastrointestinal: Negative for nausea, vomiting, abdominal pain, diarrhea, constipation, blood in stool, abdominal distention and anal bleeding.  ?Genitourinary: Negative for dysuria, hematuria, flank pain and difficulty urinating.  ?Musculoskeletal: Negative for myalgias, back pain, joint swelling, arthralgias and gait problem.  ?Skin: Negative for color change, pallor, rash and wound.  ?Neurological: Negative for dizziness, tremors, weakness and light-headedness.  ?Hematological: Negative for adenopathy. Does not bruise/bleed easily.  ?Psychiatric/Behavioral: Negative for behavioral problems, confusion, sleep disturbance, dysphoric mood, decreased concentration and agitation.  ? ?Physical Exam  ? ?BP 128/79   Pulse 71   Temp 98 ?F (36.7 ?C) (Oral)   Ht 5' (1.524 m)   Wt 194 lb (88 kg)   SpO2 98%   BMI 37.89 kg/m?   ?Physical Exam  ?Constitutional:  oriented to person, place, and time. appears well-developed and well-nourished. No distress.  ?HENT: [place]/AT, PERRLA, no scleral icterus ?Mouth/Throat: Oropharynx is clear and moist. No oropharyngeal exudate.  ?Cardiovascular: Normal rate, regular rhythm and normal heart sounds. Exam reveals no gallop and no friction rub.  ?No murmur heard.  ?Pulmonary/Chest: Effort normal and breath sounds normal. No respiratory distress.  has no wheezes.  ?Neck = supple, no nuchal  rigidity ?Lymphadenopathy: no cervical adenopathy. No axillary adenopathy ?Neurological: alert and oriented to person, place, and time.  ?Skin: Skin is warm and dry. No rash noted. No  erythema.  ?Psychiatric: a normal mood and affect.  behavior is normal.  ? ?Lab Results  ?Component Value Date  ? CD4TCELL 39 10/07/2021  ? ?Lab Results  ?Component Value Date  ? CD4TABS 1,309 10/07/2021  ? CD4TABS 1,363 02/28/2021  ? CD4TABS 1,105 08/27/2020  ? ?Lab Results  ?Component Value Date  ? HIV1RNAQUANT NOT DETECTED 10/07/2021  ? ?Lab Results  ?Component Value Date  ? HEPBSAB NON-REACTIVE 02/28/2021  ? ?Lab Results  ?Component Value Date  ? LABRPR NON-REACTIVE 10/07/2021  ? ? ?CBC ?Lab Results  ?Component Value Date  ? WBC 7.5 10/07/2021  ? RBC 4.06 10/07/2021  ? HGB 13.4 10/07/2021  ? HCT 39.5 10/07/2021  ? PLT 224 10/07/2021  ? MCV 97.3 10/07/2021  ? MCH 33.0 10/07/2021  ? MCHC 33.9 10/07/2021  ? RDW 12.5 10/07/2021  ? LYMPHSABS 3,540 10/07/2021  ? MONOABS 0.5 02/15/2018  ? EOSABS 68 10/07/2021  ? ? ?BMET ?Lab Results  ?Component Value Date  ? NA 142 10/07/2021  ? K 3.5 10/07/2021  ? CL 102 10/07/2021  ? CO2 33 (H) 10/07/2021  ? GLUCOSE 106 (H) 10/07/2021  ? BUN 13 10/07/2021  ? CREATININE 0.89 10/07/2021  ? CALCIUM 9.9 10/07/2021  ? GFRNONAA 75 11/09/2019  ? GFRAA 86 11/09/2019  ? ? ? ? ?Assessment and Plan ?HIV disease= reviewed labs with patient, well controlled. Continues to be undetectable. Continue on biktarvy. Gave refill for 12 months ? ?Osteopenia = caltrate with D3 to add onto her daily regimen ? ?Long term medication management = cr is stable ? ?Htn = well controlled no change with medications ? ?Health maintenance = Mammo in may; flu in the Fall. Can get covid booster (2nd bivalent) ? ?Rtc in 6 months. Only needs blood work annually ? ?

## 2021-11-11 ENCOUNTER — Ambulatory Visit
Admission: RE | Admit: 2021-11-11 | Discharge: 2021-11-11 | Disposition: A | Discharge status: Home / Self Care | Payer: PPO | Source: Ambulatory Visit | Attending: Internal Medicine | Admitting: Internal Medicine

## 2021-11-11 DIAGNOSIS — Z1231 Encounter for screening mammogram for malignant neoplasm of breast: Secondary | ICD-10-CM | POA: Diagnosis not present

## 2021-11-13 ENCOUNTER — Other Ambulatory Visit: Payer: Self-pay | Admitting: Internal Medicine

## 2021-11-13 DIAGNOSIS — B20 Human immunodeficiency virus [HIV] disease: Secondary | ICD-10-CM

## 2021-11-28 ENCOUNTER — Ambulatory Visit: Payer: PPO

## 2021-11-28 ENCOUNTER — Other Ambulatory Visit: Payer: Self-pay

## 2022-01-02 ENCOUNTER — Other Ambulatory Visit: Payer: Self-pay

## 2022-01-02 ENCOUNTER — Ambulatory Visit: Payer: PPO

## 2022-01-17 ENCOUNTER — Encounter: Payer: Self-pay | Admitting: Internal Medicine

## 2022-02-27 DIAGNOSIS — E039 Hypothyroidism, unspecified: Secondary | ICD-10-CM | POA: Diagnosis not present

## 2022-02-27 DIAGNOSIS — Z5181 Encounter for therapeutic drug level monitoring: Secondary | ICD-10-CM | POA: Diagnosis not present

## 2022-02-27 DIAGNOSIS — I10 Essential (primary) hypertension: Secondary | ICD-10-CM | POA: Diagnosis not present

## 2022-02-27 DIAGNOSIS — B20 Human immunodeficiency virus [HIV] disease: Secondary | ICD-10-CM | POA: Diagnosis not present

## 2022-02-27 DIAGNOSIS — I5189 Other ill-defined heart diseases: Secondary | ICD-10-CM | POA: Diagnosis not present

## 2022-02-27 DIAGNOSIS — R7309 Other abnormal glucose: Secondary | ICD-10-CM | POA: Diagnosis not present

## 2022-02-27 DIAGNOSIS — Z0000 Encounter for general adult medical examination without abnormal findings: Secondary | ICD-10-CM | POA: Diagnosis not present

## 2022-02-27 DIAGNOSIS — Z23 Encounter for immunization: Secondary | ICD-10-CM | POA: Diagnosis not present

## 2022-04-21 ENCOUNTER — Ambulatory Visit: Payer: PPO | Admitting: Internal Medicine

## 2022-05-01 ENCOUNTER — Ambulatory Visit: Payer: PPO | Admitting: Internal Medicine

## 2022-05-01 ENCOUNTER — Encounter: Payer: Self-pay | Admitting: Internal Medicine

## 2022-05-01 ENCOUNTER — Other Ambulatory Visit: Payer: Self-pay

## 2022-05-01 VITALS — BP 128/80 | HR 71 | Temp 97.7°F | Ht 60.0 in | Wt 195.0 lb

## 2022-05-01 DIAGNOSIS — Z23 Encounter for immunization: Secondary | ICD-10-CM

## 2022-05-01 DIAGNOSIS — Z79899 Other long term (current) drug therapy: Secondary | ICD-10-CM | POA: Diagnosis not present

## 2022-05-01 DIAGNOSIS — B20 Human immunodeficiency virus [HIV] disease: Secondary | ICD-10-CM

## 2022-05-01 MED ORDER — ROSUVASTATIN CALCIUM 10 MG PO TABS: 10.0000 mg | ORAL_TABLET | Freq: Every day | ORAL | 11 refills | Status: DC

## 2022-05-01 NOTE — Progress Notes (Signed)
RFV: follow up for hiv disease Patient ID: Tamara Lucas, female   DOB: Dec 08, 1955, 66 y.o.   MRN: [id number]  HPI Tamara Lucas is a 66yo F with well controlled hiv disease, currently on biktarvy. Had her pcp visit last month which went well. Up to date on mammo this past may. Overall, has been in good health. Continues to be active with church activities. Went to church in person for the first time since covid-19.   Has had #15 since april  Outpatient Encounter Medications as of 05/01/2022  Medication Sig   acetaminophen (TYLENOL) 325 MG tablet Take 650 mg by mouth every 6 (six) hours as needed for mild pain, moderate pain, fever or headache.    bictegravir-emtricitabine-tenofovir AF (BIKTARVY) 50-200-25 MG TABS tablet Take 1 tablet by mouth daily.   Calcium Carbonate-Vit D-Min (CALTRATE 600+D PLUS MINERALS) 600-800 MG-UNIT TABS Take 1 tablet by mouth daily.   levothyroxine (SYNTHROID, LEVOTHROID) 75 MCG tablet Take 75 mcg by mouth daily before breakfast.    losartan-hydrochlorothiazide (HYZAAR) 50-12.5 MG tablet Take 1 tablet by mouth daily.   No facility-administered encounter medications on file as of 05/01/2022.     Patient Active Problem List   Diagnosis Date Noted   HIV DISEASE 08/28/2006   HYPOTHYROIDISM 08/28/2006   HYPERTENSION 08/28/2006     Health Maintenance Due  Topic Date Due   COVID-19 Vaccine (1) Never done   Zoster Vaccines- Shingrix (1 of 2) Never done   COLONOSCOPY (Pts 45-37yr Insurance coverage will need to be confirmed)  09/25/2020   TETANUS/TDAP  10/10/2020   Medicare Annual Wellness (AWV)  02/01/2022     Review of Systems +weight gain, otherwise 12 point ros is negative Physical Exam   BP 128/80   Pulse 71   Temp 97.7 F (36.5 C) (Temporal)   Ht 5' (1.524 m)   Wt 195 lb (88.5 kg)   SpO2 95%   BMI 38.08 kg/m   Physical Exam  Constitutional:  oriented to person, place, and time. appears well-developed and well-nourished. No distress.  HENT:  Tamara Lucas, PERRLA, no scleral icterus Mouth/Throat: Oropharynx is clear and moist. No oropharyngeal exudate.  Cardiovascular: Normal rate, regular rhythm and normal heart sounds. Exam reveals no gallop and no friction rub.  No murmur heard.  Pulmonary/Chest: Effort normal and breath sounds normal. No respiratory distress.  has no wheezes.  Neck = supple, no nuchal rigidity Abdominal: Soft. Bowel sounds are normal.  exhibits no distension. There is no tenderness.  Lymphadenopathy: no cervical adenopathy. No axillary adenopathy Neurological: alert and oriented to person, place, and time.  Skin: Skin is warm and dry. No rash noted. No erythema.  Psychiatric: a normal mood and affect.  behavior is normal.   Lab Results  Component Value Date   CD4TCELL 39 10/07/2021   Lab Results  Component Value Date   CD4TABS 1,309 10/07/2021   CD4TABS 1,363 02/28/2021   CD4TABS 1,105 08/27/2020   Lab Results  Component Value Date   HIV1RNAQUANT NOT DETECTED 10/07/2021   Lab Results  Component Value Date   HEPBSAB NON-REACTIVE 02/28/2021   Lab Results  Component Value Date   LABRPR NON-REACTIVE 10/07/2021    CBC Lab Results  Component Value Date   WBC 7.5 10/07/2021   RBC 4.06 10/07/2021   HGB 13.4 10/07/2021   HCT 39.5 10/07/2021   PLT 224 10/07/2021   MCV 97.3 10/07/2021   MCH 33.0 10/07/2021   MCHC 33.9 10/07/2021   RDW 12.5 10/07/2021  LYMPHSABS 3,540 10/07/2021   MONOABS 0.5 02/15/2018   EOSABS 68 10/07/2021    BMET Lab Results  Component Value Date   NA 142 10/07/2021   K 3.5 10/07/2021   CL 102 10/07/2021   CO2 33 (H) 10/07/2021   GLUCOSE 106 (H) 10/07/2021   BUN 13 10/07/2021   CREATININE 0.89 10/07/2021   CALCIUM 9.9 10/07/2021   GFRNONAA 75 11/09/2019   GFRAA 86 11/09/2019      Assessment and Plan  HIV disease= continue on biktarvy.; will get labs today  Long term medication = will get labs to check cr  Losing weight is her goal  Health  maintenance= Reprieve trial will start crestor '10mg'$  daily

## 2022-05-02 LAB — T-HELPER CELL (CD4) - (RCID CLINIC ONLY)
CD4 % Helper T Cell: 43 % (ref 33–65)
CD4 T Cell Abs: 1461 /uL (ref 400–1790)

## 2022-05-03 LAB — CBC WITH DIFFERENTIAL/PLATELET
Absolute Monocytes: 500 cells/uL (ref 200–950)
Basophils Absolute: 57 cells/uL (ref 0–200)
Basophils Relative: 0.7 %
Eosinophils Absolute: 57 cells/uL (ref 15–500)
Eosinophils Relative: 0.7 %
HCT: 40.5 % (ref 35.0–45.0)
Hemoglobin: 13.5 g/dL (ref 11.7–15.5)
Lymphs Abs: 3838 cells/uL (ref 850–3900)
MCH: 32.6 pg (ref 27.0–33.0)
MCHC: 33.3 g/dL (ref 32.0–36.0)
MCV: 97.8 fL (ref 80.0–100.0)
MPV: 11.5 fL (ref 7.5–12.5)
Monocytes Relative: 6.1 %
Neutro Abs: 3747 cells/uL (ref 1500–7800)
Neutrophils Relative %: 45.7 %
Platelets: 226 10*3/uL (ref 140–400)
RBC: 4.14 10*6/uL (ref 3.80–5.10)
RDW: 12.6 % (ref 11.0–15.0)
Total Lymphocyte: 46.8 %
WBC: 8.2 10*3/uL (ref 3.8–10.8)

## 2022-05-03 LAB — COMPLETE METABOLIC PANEL WITH GFR
AG Ratio: 1.4 (calc) (ref 1.0–2.5)
ALT: 9 U/L (ref 6–29)
AST: 15 U/L (ref 10–35)
Albumin: 4.2 g/dL (ref 3.6–5.1)
Alkaline phosphatase (APISO): 61 U/L (ref 37–153)
BUN: 13 mg/dL (ref 7–25)
CO2: 29 mmol/L (ref 20–32)
Calcium: 9.4 mg/dL (ref 8.6–10.4)
Chloride: 105 mmol/L (ref 98–110)
Creat: 0.95 mg/dL (ref 0.50–1.05)
Globulin: 2.9 g/dL (calc) (ref 1.9–3.7)
Glucose, Bld: 90 mg/dL (ref 65–99)
Potassium: 3.5 mmol/L (ref 3.5–5.3)
Sodium: 143 mmol/L (ref 135–146)
Total Bilirubin: 0.3 mg/dL (ref 0.2–1.2)
Total Protein: 7.1 g/dL (ref 6.1–8.1)
eGFR: 65 mL/min/{1.73_m2} (ref >=60)

## 2022-05-03 LAB — HIV-1 RNA QUANT-NO REFLEX-BLD
HIV 1 RNA Quant: NOT DETECTED Copies/mL
HIV-1 RNA Quant, Log: NOT DETECTED Log cps/mL

## 2022-05-03 LAB — LIPID PANEL
Cholesterol: 203 mg/dL — ABNORMAL HIGH (ref <200)
HDL: 67 mg/dL (ref >=50)
LDL Cholesterol (Calc): 105 mg/dL (calc) — ABNORMAL HIGH
Non-HDL Cholesterol (Calc): 136 mg/dL (calc) — ABNORMAL HIGH (ref <130)
Total CHOL/HDL Ratio: 3 (calc) (ref <5.0)
Triglycerides: 185 mg/dL — ABNORMAL HIGH (ref <150)

## 2022-05-03 LAB — RPR: RPR Ser Ql: NONREACTIVE

## 2022-10-07 ENCOUNTER — Other Ambulatory Visit: Payer: Self-pay | Admitting: Internal Medicine

## 2022-10-07 DIAGNOSIS — Z1231 Encounter for screening mammogram for malignant neoplasm of breast: Secondary | ICD-10-CM

## 2022-10-07 IMAGING — MG MM DIGITAL SCREENING BILAT W/ TOMO AND CAD
8 series · 8 of 24 positions shown · non-contrast
Comparison: Previous exam(s).

CLINICAL DATA: Screening.

EXAM:
DIGITAL SCREENING BILATERAL MAMMOGRAM WITH TOMOSYNTHESIS AND CAD
TECHNIQUE: Bilateral screening digital craniocaudal and mediolateral oblique
mammograms were obtained. Bilateral screening digital breast
tomosynthesis was performed. The images were evaluated with
computer-aided detection.

[L CC synth-2D]
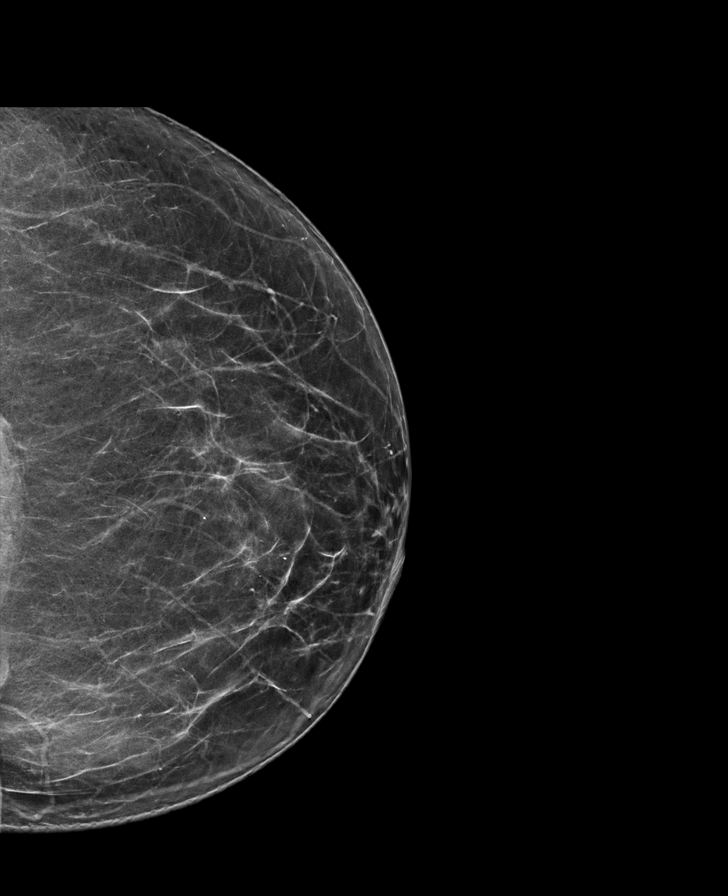

[R CC synth-2D]
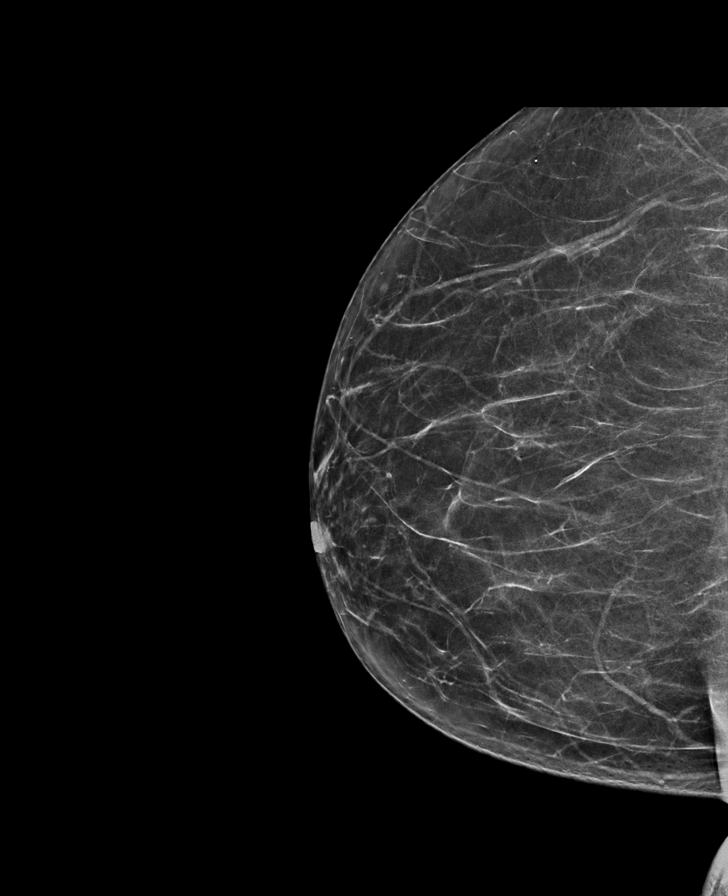

[R MLO synth-2D]
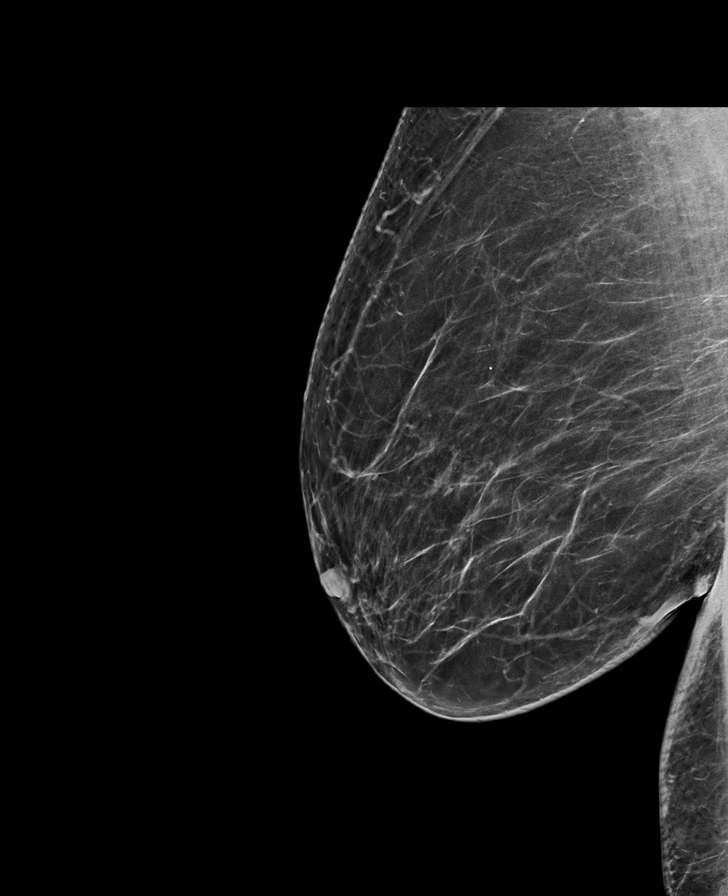

[L MLO synth-2D]
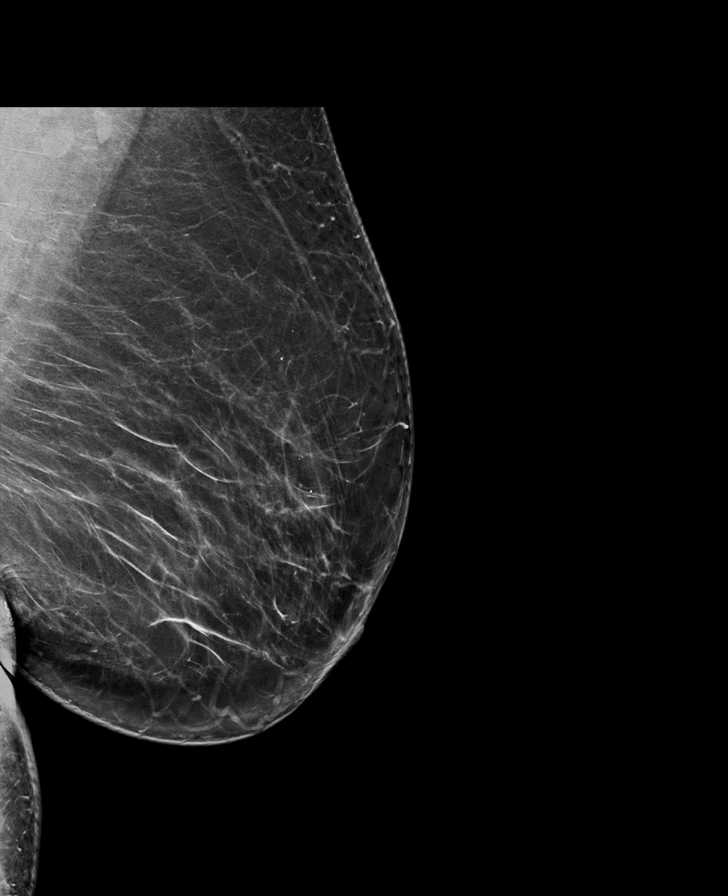

[L MLO tomo · tomo slice 39/78.0]
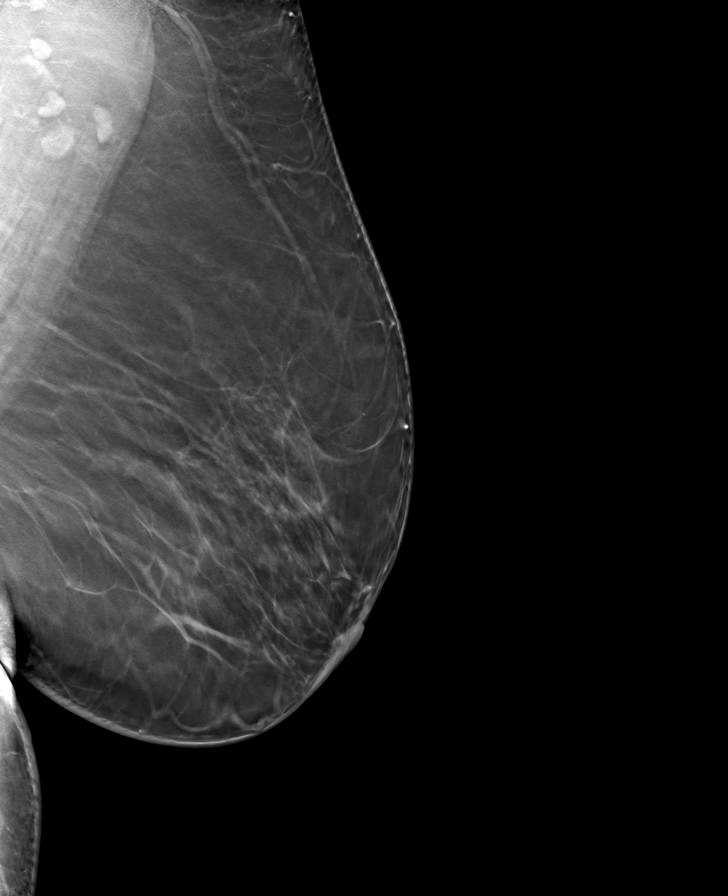

[R MLO tomo · tomo slice 37/74.0]
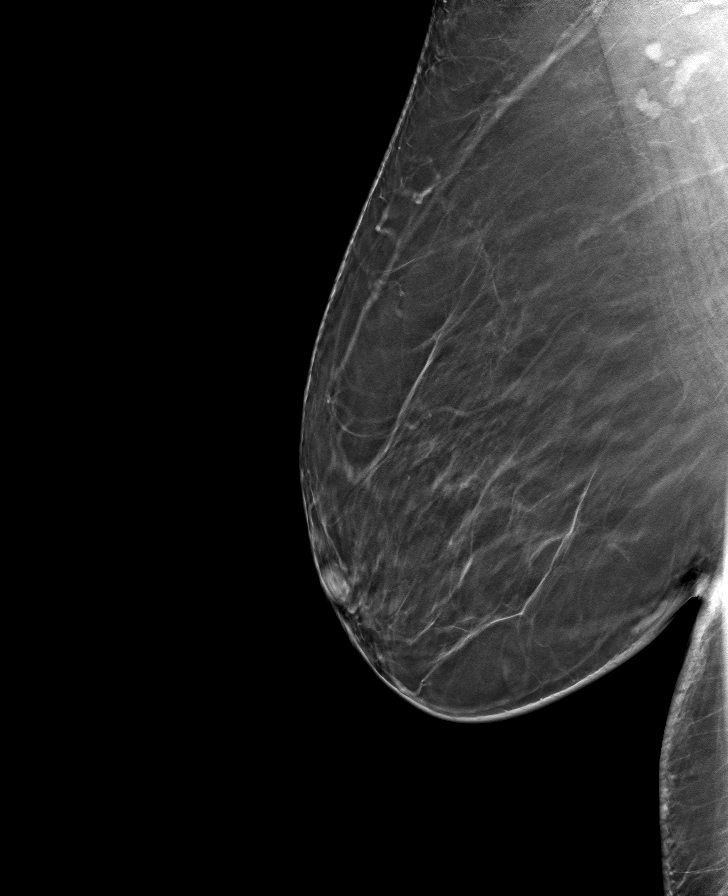

[R CC tomo · tomo slice 29/57.0]
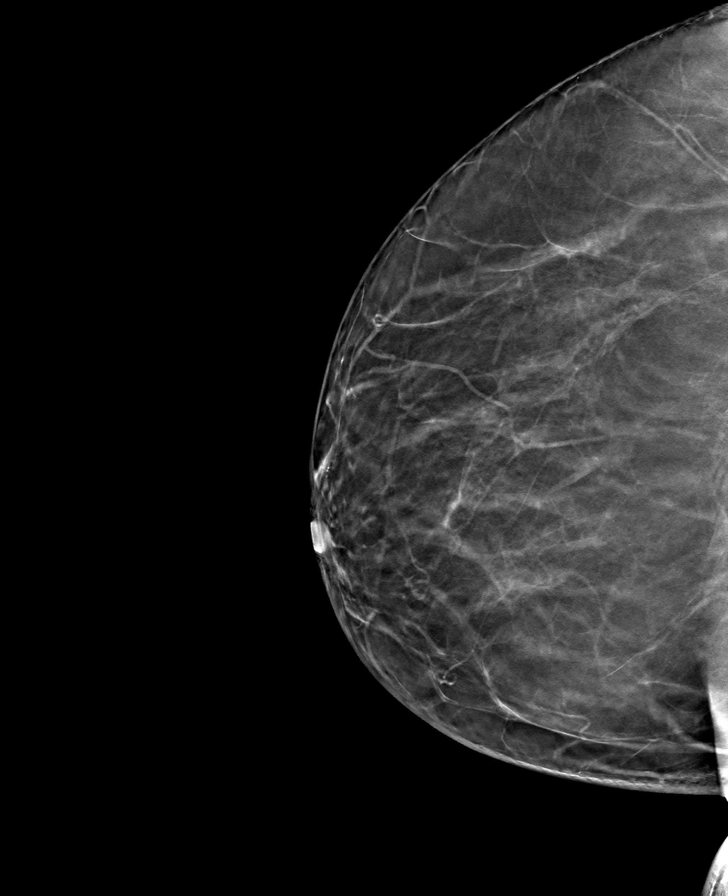

[L CC tomo · tomo slice 33/65.0]
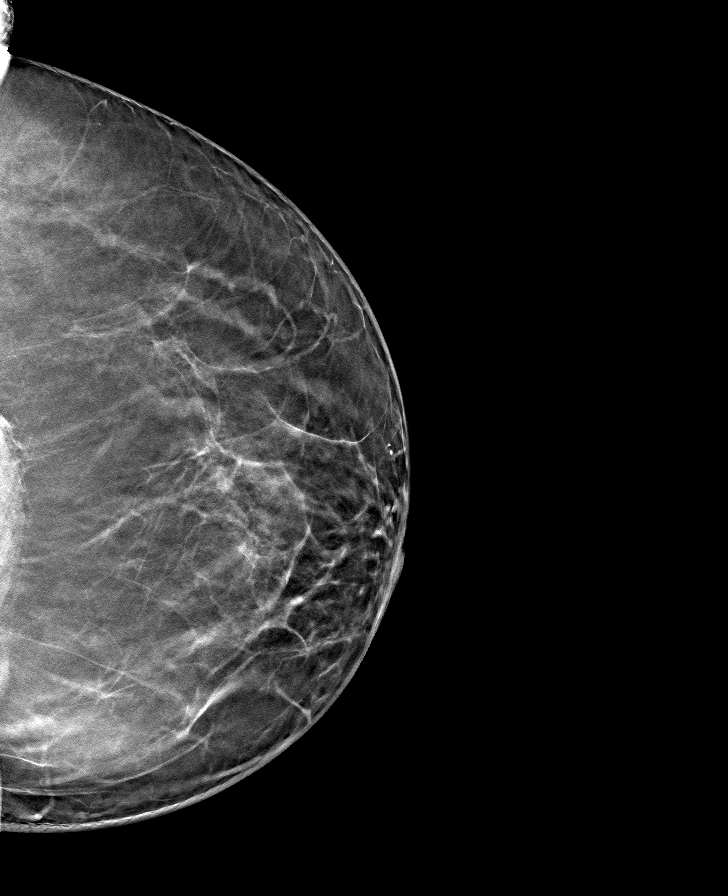

[8 of 24 positions shown; findings below may reference images not displayed]

ACR Breast Density Category b: There are scattered areas of
fibroglandular density.
FINDINGS: There are no findings suspicious for malignancy.
IMPRESSION: No mammographic evidence of malignancy. A result letter of this
screening mammogram will be mailed directly to the patient.

RECOMMENDATION:
Screening mammogram in one year. (Code:51-O-LD2)

BI-RADS CATEGORY  1: Negative.

## 2022-10-10 ENCOUNTER — Other Ambulatory Visit: Payer: Self-pay | Admitting: Internal Medicine

## 2022-10-10 DIAGNOSIS — B20 Human immunodeficiency virus [HIV] disease: Secondary | ICD-10-CM

## 2022-10-27 ENCOUNTER — Ambulatory Visit: Payer: PPO | Admitting: Internal Medicine

## 2022-10-30 ENCOUNTER — Encounter: Payer: Self-pay | Admitting: Internal Medicine

## 2022-10-30 ENCOUNTER — Ambulatory Visit (INDEPENDENT_AMBULATORY_CARE_PROVIDER_SITE_OTHER): Payer: PPO | Admitting: Internal Medicine

## 2022-10-30 ENCOUNTER — Other Ambulatory Visit: Payer: Self-pay

## 2022-10-30 VITALS — BP 127/79 | HR 75 | Resp 16 | Wt 197.0 lb

## 2022-10-30 DIAGNOSIS — I10 Essential (primary) hypertension: Secondary | ICD-10-CM

## 2022-10-30 DIAGNOSIS — Z79899 Other long term (current) drug therapy: Secondary | ICD-10-CM | POA: Diagnosis not present

## 2022-10-30 DIAGNOSIS — B20 Human immunodeficiency virus [HIV] disease: Secondary | ICD-10-CM | POA: Diagnosis not present

## 2022-10-30 LAB — CBC WITH DIFFERENTIAL/PLATELET
Absolute Monocytes: 486 cells/uL (ref 200–950)
Hemoglobin: 12.7 g/dL (ref 11.7–15.5)
Monocytes Relative: 6 %
Neutro Abs: 3556 cells/uL (ref 1500–7800)
Platelets: 243 10*3/uL (ref 140–400)
RBC: 3.93 10*6/uL (ref 3.80–5.10)

## 2022-10-30 NOTE — Progress Notes (Signed)
Patient ID: Tamara Lucas, female   DOB: [birth year], 67 y.o.   MRN: [id number]  HPI 67yo F with well controlled HIV disease. Currently taking biktarvy daily. Not missing doses, takes meds through pillbox to keep on schedule. Denies any health issues. Recently starting to increase exercise.  Outpatient Encounter Medications as of 10/30/2022  Medication Sig   acetaminophen (TYLENOL) 325 MG tablet Take 650 mg by mouth every 6 (six) hours as needed for mild pain, moderate pain, fever or headache.    bictegravir-emtricitabine-tenofovir AF (BIKTARVY) 50-200-25 MG TABS tablet TAKE 1 TABLET BY MOUTH DAILY   Calcium Carbonate-Vit D-Min (CALTRATE 600+D PLUS MINERALS) 600-800 MG-UNIT TABS Take 1 tablet by mouth daily.   levothyroxine (SYNTHROID, LEVOTHROID) 75 MCG tablet Take 75 mcg by mouth daily before breakfast.    losartan-hydrochlorothiazide (HYZAAR) 50-12.5 MG tablet Take 1 tablet by mouth daily.   rosuvastatin (CRESTOR) 10 MG tablet Take 1 tablet (10 mg total) by mouth daily.   No facility-administered encounter medications on file as of 10/30/2022.     Patient Active Problem List   Diagnosis Date Noted   HIV DISEASE 08/28/2006   HYPOTHYROIDISM 08/28/2006   HYPERTENSION 08/28/2006     Health Maintenance Due  Topic Date Due   COLONOSCOPY (Pts 45-64yrs Insurance coverage will need to be confirmed)  09/25/2020   Medicare Annual Wellness (AWV)  02/01/2022     Review of Systems Review of Systems  Constitutional: Negative for fever, chills, diaphoresis, activity change, appetite change, fatigue and unexpected weight change.  HENT: Negative for congestion, sore throat, rhinorrhea, sneezing, trouble swallowing and sinus pressure.  Eyes: Negative for photophobia and visual disturbance.  Respiratory: Negative for cough, chest tightness, shortness of breath, wheezing and stridor.  Cardiovascular: Negative for chest pain, palpitations and leg swelling.  Gastrointestinal: Negative for  nausea, vomiting, abdominal pain, diarrhea, constipation, blood in stool, abdominal distention and anal bleeding.  Genitourinary: Negative for dysuria, hematuria, flank pain and difficulty urinating.  Musculoskeletal: Negative for myalgias, back pain, joint swelling, arthralgias and gait problem.  Skin: Negative for color change, pallor, rash and wound.  Neurological: Negative for dizziness, tremors, weakness and light-headedness.  Hematological: Negative for adenopathy. Does not bruise/bleed easily.  Psychiatric/Behavioral: Negative for behavioral problems, confusion, sleep disturbance, dysphoric mood, decreased concentration and agitation.   Physical Exam   BP 127/79   Pulse 75   Resp 16   Wt 197 lb (89.4 kg)   SpO2 97%   BMI 38.47 kg/m   Physical Exam  Constitutional:  oriented to person, place, and time. appears well-developed and well-nourished. No distress.  HENT: [place]/AT, PERRLA, no scleral icterus Mouth/Throat: Oropharynx is clear and moist. No oropharyngeal exudate.  Cardiovascular: Normal rate, regular rhythm and normal heart sounds. Exam reveals no gallop and no friction rub.  No murmur heard.  Pulmonary/Chest: Effort normal and breath sounds normal. No respiratory distress.  has no wheezes.  Neck = supple, no nuchal rigidity Abdominal: Soft. Bowel sounds are normal.  exhibits no distension. There is no tenderness.  Lymphadenopathy: no cervical adenopathy. No axillary adenopathy Neurological: alert and oriented to person, place, and time.  Skin: Skin is warm and dry. No rash noted. No erythema.  Psychiatric: a normal mood and affect.  behavior is normal.   Lab Results  Component Value Date   CD4TCELL 43 05/01/2022   Lab Results  Component Value Date   CD4TABS 1,461 05/01/2022   CD4TABS 1,309 10/07/2021   CD4TABS 1,363 02/28/2021   Lab Results  Component Value Date   HIV1RNAQUANT Not Detected 05/01/2022   Lab Results  Component Value Date   HEPBSAB NON-REACTIVE  02/28/2021   Lab Results  Component Value Date   LABRPR NON-REACTIVE 05/01/2022    CBC Lab Results  Component Value Date   WBC 8.2 05/01/2022   RBC 4.14 05/01/2022   HGB 13.5 05/01/2022   HCT 40.5 05/01/2022   PLT 226 05/01/2022   MCV 97.8 05/01/2022   MCH 32.6 05/01/2022   MCHC 33.3 05/01/2022   RDW 12.6 05/01/2022   LYMPHSABS 3,838 05/01/2022   MONOABS 0.5 02/15/2018   EOSABS 57 05/01/2022    BMET Lab Results  Component Value Date   NA 143 05/01/2022   K 3.5 05/01/2022   CL 105 05/01/2022   CO2 29 05/01/2022   GLUCOSE 90 05/01/2022   BUN 13 05/01/2022   CREATININE 0.95 05/01/2022   CALCIUM 9.4 05/01/2022   GFRNONAA 75 11/09/2019   GFRAA 86 11/09/2019      Assessment and Plan  HIV disease= will check labs to see still is undetectable. Has sufficient refills  Long term medication management = will check kidney function  Health maintenance = continue on rosuvastatin for primary prevention. Otherwise dr polite taking care of other needs  Htn = under good control. No need to change regimen.

## 2022-10-31 ENCOUNTER — Telehealth: Payer: Self-pay

## 2022-10-31 LAB — LIPID PANEL
HDL: 70 mg/dL (ref >=50)
LDL Cholesterol (Calc): 60 mg/dL (calc)
Triglycerides: 73 mg/dL (ref <150)

## 2022-10-31 LAB — RPR: RPR Ser Ql: NONREACTIVE

## 2022-10-31 LAB — COMPLETE METABOLIC PANEL WITH GFR
AG Ratio: 1.7 (calc) (ref 1.0–2.5)
Chloride: 103 mmol/L (ref 98–110)
Potassium: 3.3 mmol/L — ABNORMAL LOW (ref 3.5–5.3)
Sodium: 142 mmol/L (ref 135–146)

## 2022-10-31 LAB — T-HELPER CELL (CD4) - (RCID CLINIC ONLY)
CD4 % Helper T Cell: 39 % (ref 33–65)
CD4 T Cell Abs: 1491 /uL (ref 400–1790)

## 2022-10-31 MED ORDER — POTASSIUM CHLORIDE CRYS ER 10 MEQ PO TBCR: EXTENDED_RELEASE_TABLET | ORAL | 0 refills | Status: DC

## 2022-10-31 NOTE — Telephone Encounter (Signed)
Per Dr.Snider - potassium is on lower side.   Left voicemail informing patient - RX sent to [facility] on [address].    Alverna Fawley Lesli Albee, CMA

## 2022-11-01 LAB — COMPLETE METABOLIC PANEL WITH GFR
ALT: 10 U/L (ref 6–29)
AST: 15 U/L (ref 10–35)
Albumin: 4.5 g/dL (ref 3.6–5.1)
Alkaline phosphatase (APISO): 65 U/L (ref 37–153)
BUN: 13 mg/dL (ref 7–25)
CO2: 30 mmol/L (ref 20–32)
Calcium: 9.6 mg/dL (ref 8.6–10.4)
Creat: 0.92 mg/dL (ref 0.50–1.05)
Globulin: 2.6 g/dL (calc) (ref 1.9–3.7)
Glucose, Bld: 105 mg/dL — ABNORMAL HIGH (ref 65–99)
Total Bilirubin: 0.5 mg/dL (ref 0.2–1.2)
Total Protein: 7.1 g/dL (ref 6.1–8.1)
eGFR: 67 mL/min/{1.73_m2} (ref >=60)

## 2022-11-01 LAB — CBC WITH DIFFERENTIAL/PLATELET
Basophils Absolute: 41 cells/uL (ref 0–200)
Basophils Relative: 0.5 %
Eosinophils Absolute: 49 cells/uL (ref 15–500)
Eosinophils Relative: 0.6 %
HCT: 37.4 % (ref 35.0–45.0)
Lymphs Abs: 3969 cells/uL — ABNORMAL HIGH (ref 850–3900)
MCH: 32.3 pg (ref 27.0–33.0)
MCHC: 34 g/dL (ref 32.0–36.0)
MCV: 95.2 fL (ref 80.0–100.0)
MPV: 10.3 fL (ref 7.5–12.5)
Neutrophils Relative %: 43.9 %
RDW: 12.6 % (ref 11.0–15.0)
Total Lymphocyte: 49 %
WBC: 8.1 10*3/uL (ref 3.8–10.8)

## 2022-11-01 LAB — HIV-1 RNA QUANT-NO REFLEX-BLD
HIV 1 RNA Quant: NOT DETECTED Copies/mL
HIV-1 RNA Quant, Log: NOT DETECTED Log cps/mL

## 2022-11-01 LAB — LIPID PANEL
Cholesterol: 145 mg/dL (ref <200)
Non-HDL Cholesterol (Calc): 75 mg/dL (calc) (ref <130)
Total CHOL/HDL Ratio: 2.1 (calc) (ref <5.0)

## 2022-11-14 ENCOUNTER — Ambulatory Visit
Admission: RE | Admit: 2022-11-14 | Discharge: 2022-11-14 | Disposition: A | Discharge status: Home / Self Care | Payer: PPO | Source: Ambulatory Visit | Attending: Internal Medicine | Admitting: Internal Medicine

## 2022-11-14 DIAGNOSIS — Z1231 Encounter for screening mammogram for malignant neoplasm of breast: Secondary | ICD-10-CM

## 2022-11-26 ENCOUNTER — Other Ambulatory Visit: Payer: Self-pay | Admitting: Internal Medicine

## 2022-11-26 ENCOUNTER — Encounter: Payer: Self-pay | Admitting: Internal Medicine

## 2022-12-25 ENCOUNTER — Other Ambulatory Visit: Payer: Self-pay | Admitting: Internal Medicine

## 2022-12-25 NOTE — Telephone Encounter (Signed)
Patient has PCP.   Analucia Hush D Miley Lindon, RN  

## 2022-12-29 ENCOUNTER — Ambulatory Visit: Payer: PPO

## 2022-12-29 ENCOUNTER — Other Ambulatory Visit: Payer: Self-pay

## 2023-01-28 DIAGNOSIS — H10023 Other mucopurulent conjunctivitis, bilateral: Secondary | ICD-10-CM | POA: Diagnosis not present

## 2023-02-02 DIAGNOSIS — H1013 Acute atopic conjunctivitis, bilateral: Secondary | ICD-10-CM | POA: Diagnosis not present

## 2023-04-01 ENCOUNTER — Other Ambulatory Visit: Payer: Self-pay | Admitting: Internal Medicine

## 2023-04-05 ENCOUNTER — Other Ambulatory Visit: Payer: Self-pay | Admitting: Internal Medicine

## 2023-04-05 DIAGNOSIS — B20 Human immunodeficiency virus [HIV] disease: Secondary | ICD-10-CM

## 2023-04-07 DIAGNOSIS — E039 Hypothyroidism, unspecified: Secondary | ICD-10-CM | POA: Diagnosis not present

## 2023-04-07 DIAGNOSIS — I5189 Other ill-defined heart diseases: Secondary | ICD-10-CM | POA: Diagnosis not present

## 2023-04-07 DIAGNOSIS — R7303 Prediabetes: Secondary | ICD-10-CM | POA: Diagnosis not present

## 2023-04-07 DIAGNOSIS — Z23 Encounter for immunization: Secondary | ICD-10-CM | POA: Diagnosis not present

## 2023-04-07 DIAGNOSIS — I110 Hypertensive heart disease with heart failure: Secondary | ICD-10-CM | POA: Diagnosis not present

## 2023-04-07 DIAGNOSIS — I5032 Chronic diastolic (congestive) heart failure: Secondary | ICD-10-CM | POA: Diagnosis not present

## 2023-04-07 DIAGNOSIS — B20 Human immunodeficiency virus [HIV] disease: Secondary | ICD-10-CM | POA: Diagnosis not present

## 2023-04-07 DIAGNOSIS — I10 Essential (primary) hypertension: Secondary | ICD-10-CM | POA: Diagnosis not present

## 2023-04-07 DIAGNOSIS — Z0000 Encounter for general adult medical examination without abnormal findings: Secondary | ICD-10-CM | POA: Diagnosis not present

## 2023-05-04 ENCOUNTER — Other Ambulatory Visit: Payer: Self-pay

## 2023-05-04 ENCOUNTER — Ambulatory Visit: Payer: PPO | Admitting: Internal Medicine

## 2023-05-04 ENCOUNTER — Encounter: Payer: Self-pay | Admitting: Internal Medicine

## 2023-05-04 VITALS — BP 121/82 | HR 67 | Temp 97.3°F | Ht 59.0 in | Wt 192.0 lb

## 2023-05-04 DIAGNOSIS — B20 Human immunodeficiency virus [HIV] disease: Secondary | ICD-10-CM

## 2023-05-04 DIAGNOSIS — Z8639 Personal history of other endocrine, nutritional and metabolic disease: Secondary | ICD-10-CM

## 2023-05-04 DIAGNOSIS — Z79899 Other long term (current) drug therapy: Secondary | ICD-10-CM

## 2023-05-04 MED ORDER — BIKTARVY 50-200-25 MG PO TABS: 1.0000 | ORAL_TABLET | Freq: Every day | ORAL | 11 refills | Status: DC

## 2023-05-04 NOTE — Progress Notes (Signed)
Patient ID: Tamara Lucas, female   DOB: October 02, 1955, 67 y.o.   MRN: [id number]  HPI Tamara Lucas is a 67yo F with well controlled HIV disease. Overall doing well with her health. She is trying to lose some unintentional weight gain. She has had afew family relatives pass away since the last visit. She is excited about the elections.  Outpatient Encounter Medications as of 05/04/2023  Medication Sig   acetaminophen (TYLENOL) 325 MG tablet Take 650 mg by mouth every 6 (six) hours as needed for mild pain, moderate pain, fever or headache.    BIKTARVY 50-200-25 MG TABS tablet TAKE 1 TABLET BY MOUTH DAILY   Calcium Carbonate-Vit D-Min (CALTRATE 600+D PLUS MINERALS) 600-800 MG-UNIT TABS Take 1 tablet by mouth daily.   levothyroxine (SYNTHROID, LEVOTHROID) 75 MCG tablet Take 75 mcg by mouth daily before breakfast.    losartan-hydrochlorothiazide (HYZAAR) 50-12.5 MG tablet Take 1 tablet by mouth daily.   rosuvastatin (CRESTOR) 10 MG tablet TAKE 1 TABLET(10 MG) BY MOUTH DAILY   potassium chloride (KLOR-CON M) 10 MEQ tablet TAKE 2 TABLETS BY MOUTH FOR 1ST DOSE, THEN 1 TABLET DAILY. (Patient not taking: Reported on 05/04/2023)   No facility-administered encounter medications on file as of 05/04/2023.     Patient Active Problem List   Diagnosis Date Noted   HIV DISEASE 08/28/2006   HYPOTHYROIDISM 08/28/2006   HYPERTENSION 08/28/2006     Health Maintenance Due  Topic Date Due   COVID-19 Vaccine (1) Never done   Zoster Vaccines- Shingrix (1 of 2) Never done   Colonoscopy  09/25/2020   Medicare Annual Wellness (AWV)  02/28/2023     Review of Systems  Constitutional: Negative for fever, chills, diaphoresis, activity change, appetite change, fatigue and unexpected weight change.  HENT: Negative for congestion, sore throat, rhinorrhea, sneezing, trouble swallowing and sinus pressure.  Eyes: Negative for photophobia and visual disturbance.  Respiratory: Negative for cough, chest tightness,  shortness of breath, wheezing and stridor.  Cardiovascular: Negative for chest pain, palpitations and leg swelling.  Gastrointestinal: Negative for nausea, vomiting, abdominal pain, diarrhea, constipation, blood in stool, abdominal distention and anal bleeding.  Genitourinary: Negative for dysuria, hematuria, flank pain and difficulty urinating.  Musculoskeletal: Negative for myalgias, back pain, joint swelling, arthralgias and gait problem.  Skin: Negative for color change, pallor, rash and wound.  Neurological: Negative for dizziness, tremors, weakness and light-headedness.  Hematological: Negative for adenopathy. Does not bruise/bleed easily.  Psychiatric/Behavioral: Negative for behavioral problems, confusion, sleep disturbance, dysphoric mood, decreased concentration and agitation.   Physical Exam   BP 121/82   Pulse 67   Temp (!) 97.3 F (36.3 C) (Temporal)   Ht 4\' 11"  (1.499 m)   Wt 192 lb (87.1 kg)   SpO2 98%   BMI 38.78 kg/m   Physical Exam  Constitutional:  oriented to person, place, and time. appears well-developed and well-nourished. No distress.  HENT: [place]/AT, PERRLA, no scleral icterus Mouth/Throat: Oropharynx is clear and moist. No oropharyngeal exudate.  Cardiovascular: Normal rate, regular rhythm and normal heart sounds. Exam reveals no gallop and no friction rub.  No murmur heard.  Pulmonary/Chest: Effort normal and breath sounds normal. No respiratory distress.  has no wheezes.  Neck = supple, no nuchal rigidity Abdominal: Soft. Bowel sounds are normal.  exhibits no distension. There is no tenderness.  Lymphadenopathy: no cervical adenopathy. No axillary adenopathy Neurological: alert and oriented to person, place, and time.  Skin: Skin is warm and dry. No rash noted. No erythema.  Psychiatric: a normal mood and affect.  behavior is normal.   Lab Results  Component Value Date   CD4TCELL 39 10/30/2022   Lab Results  Component Value Date   CD4TABS 1,491  10/30/2022   CD4TABS 1,461 05/01/2022   CD4TABS 1,309 10/07/2021   Lab Results  Component Value Date   HIV1RNAQUANT Not Detected 10/30/2022   Lab Results  Component Value Date   HEPBSAB NON-REACTIVE 02/28/2021   Lab Results  Component Value Date   LABRPR NON-REACTIVE 10/30/2022    CBC Lab Results  Component Value Date   WBC 8.1 10/30/2022   RBC 3.93 10/30/2022   HGB 12.7 10/30/2022   HCT 37.4 10/30/2022   PLT 243 10/30/2022   MCV 95.2 10/30/2022   MCH 32.3 10/30/2022   MCHC 34.0 10/30/2022   RDW 12.6 10/30/2022   LYMPHSABS 3,969 (H) 10/30/2022   MONOABS 0.5 02/15/2018   EOSABS 49 10/30/2022    BMET Lab Results  Component Value Date   NA 142 10/30/2022   K 3.3 (L) 10/30/2022   CL 103 10/30/2022   CO2 30 10/30/2022   GLUCOSE 105 (H) 10/30/2022   BUN 13 10/30/2022   CREATININE 0.92 10/30/2022   CALCIUM 9.6 10/30/2022   GFRNONAA 75 11/09/2019   GFRAA 86 11/09/2019   Assessment and Plan Hiv disease = will refill biktarvy; will get labs  History of hypokalemia = will check bmp  Long term medication management = cr  Maintenance = already had covid and flu vaccine  Rtc 6 months

## 2023-05-05 LAB — T-HELPER CELL (CD4) - (RCID CLINIC ONLY)
CD4 % Helper T Cell: 42 % (ref 33–65)
CD4 T Cell Abs: 1402 /uL (ref 400–1790)

## 2023-05-06 LAB — COMPLETE METABOLIC PANEL WITH GFR
AG Ratio: 1.5 (calc) (ref 1.0–2.5)
ALT: 10 U/L (ref 6–29)
AST: 14 U/L (ref 10–35)
Albumin: 4.3 g/dL (ref 3.6–5.1)
Alkaline phosphatase (APISO): 69 U/L (ref 37–153)
BUN: 16 mg/dL (ref 7–25)
CO2: 29 mmol/L (ref 20–32)
Calcium: 9.4 mg/dL (ref 8.6–10.4)
Chloride: 103 mmol/L (ref 98–110)
Creat: 0.93 mg/dL (ref 0.50–1.05)
Globulin: 2.8 g/dL (ref 1.9–3.7)
Glucose, Bld: 92 mg/dL (ref 65–99)
Potassium: 3.5 mmol/L (ref 3.5–5.3)
Sodium: 143 mmol/L (ref 135–146)
Total Bilirubin: 0.6 mg/dL (ref 0.2–1.2)
Total Protein: 7.1 g/dL (ref 6.1–8.1)
eGFR: 67 mL/min/{1.73_m2} (ref >=60)

## 2023-05-06 LAB — LIPID PANEL
Cholesterol: 114 mg/dL (ref <200)
HDL: 59 mg/dL (ref >=50)
LDL Cholesterol (Calc): 37 mg/dL
Non-HDL Cholesterol (Calc): 55 mg/dL (ref <130)
Total CHOL/HDL Ratio: 1.9 (calc) (ref <5.0)
Triglycerides: 101 mg/dL (ref <150)

## 2023-05-06 LAB — HIV-1 RNA QUANT-NO REFLEX-BLD
HIV 1 RNA Quant: NOT DETECTED {copies}/mL
HIV-1 RNA Quant, Log: NOT DETECTED {Log_copies}/mL

## 2023-05-06 LAB — CBC WITH DIFFERENTIAL/PLATELET
Absolute Lymphocytes: 3711 {cells}/uL (ref 850–3900)
Absolute Monocytes: 512 {cells}/uL (ref 200–950)
Basophils Absolute: 47 {cells}/uL (ref 0–200)
Basophils Relative: 0.5 %
Eosinophils Absolute: 28 {cells}/uL (ref 15–500)
Eosinophils Relative: 0.3 %
HCT: 39.9 % (ref 35.0–45.0)
Hemoglobin: 13.4 g/dL (ref 11.7–15.5)
MCH: 32.8 pg (ref 27.0–33.0)
MCHC: 33.6 g/dL (ref 32.0–36.0)
MCV: 97.6 fL (ref 80.0–100.0)
MPV: 11 fL (ref 7.5–12.5)
Monocytes Relative: 5.5 %
Neutro Abs: 5003 {cells}/uL (ref 1500–7800)
Neutrophils Relative %: 53.8 %
Platelets: 234 10*3/uL (ref 140–400)
RBC: 4.09 10*6/uL (ref 3.80–5.10)
RDW: 13.2 % (ref 11.0–15.0)
Total Lymphocyte: 39.9 %
WBC: 9.3 10*3/uL (ref 3.8–10.8)

## 2023-05-06 LAB — RPR: RPR Ser Ql: NONREACTIVE

## 2023-09-18 ENCOUNTER — Other Ambulatory Visit: Payer: Self-pay | Admitting: Internal Medicine

## 2023-10-06 ENCOUNTER — Other Ambulatory Visit: Payer: Self-pay | Admitting: Internal Medicine

## 2023-10-06 DIAGNOSIS — Z1231 Encounter for screening mammogram for malignant neoplasm of breast: Secondary | ICD-10-CM

## 2023-11-04 ENCOUNTER — Encounter: Payer: Self-pay | Admitting: Internal Medicine

## 2023-11-04 ENCOUNTER — Ambulatory Visit: Payer: PPO | Admitting: Internal Medicine

## 2023-11-04 ENCOUNTER — Other Ambulatory Visit: Payer: Self-pay

## 2023-11-04 VITALS — BP 131/79 | HR 68 | Temp 98.3°F | Ht 59.0 in | Wt 181.0 lb

## 2023-11-04 DIAGNOSIS — Z23 Encounter for immunization: Secondary | ICD-10-CM | POA: Diagnosis not present

## 2023-11-04 DIAGNOSIS — Z79899 Other long term (current) drug therapy: Secondary | ICD-10-CM | POA: Diagnosis not present

## 2023-11-04 DIAGNOSIS — B20 Human immunodeficiency virus [HIV] disease: Secondary | ICD-10-CM

## 2023-11-04 MED ORDER — BIKTARVY 50-200-25 MG PO TABS
1.0000 | ORAL_TABLET | Freq: Every day | ORAL | 11 refills | Status: AC
Start: 2023-11-04 — End: ?

## 2023-11-04 NOTE — Progress Notes (Signed)
 RFV: follow up for hiv disease  Patient ID: Tamara Lucas, female   DOB: June 02, 1956, 68 y.o.   MRN: [id number]  HPI Tamara Lucas is a 68yo F with hiv disease, CD 4 count 1400 /VL<20 in Nov 2024. Here for 6 months visit. No issues with her health. Mammo coming up this month. Also tolerating rosuvastatin .  For physical activity --does walking. And now more agreeable since there is warmer weather  Participates in church activities 2x per week for social interaction. In addn to being with her daughter and family   Has never had covid-19.  Declines booster vaccine. Also does not want shingles vaccine ; overdue for Tdap  Outpatient Encounter Medications as of 11/04/2023  Medication Sig   acetaminophen (TYLENOL) 325 MG tablet Take 650 mg by mouth every 6 (six) hours as needed for mild pain, moderate pain, fever or headache.    bictegravir-emtricitabine-tenofovir  AF (BIKTARVY ) 50-200-25 MG TABS tablet Take 1 tablet by mouth daily.   Calcium  Carbonate-Vit D-Min (CALTRATE 600+D PLUS MINERALS) 600-800 MG-UNIT TABS Take 1 tablet by mouth daily.   levothyroxine  (SYNTHROID , LEVOTHROID) 75 MCG tablet Take 75 mcg by mouth daily before breakfast.    losartan -hydrochlorothiazide  (HYZAAR ) 50-12.5 MG tablet Take 1 tablet by mouth daily.   potassium chloride  (KLOR-CON  M) 10 MEQ tablet TAKE 2 TABLETS BY MOUTH FOR 1ST DOSE, THEN 1 TABLET DAILY.   rosuvastatin  (CRESTOR ) 10 MG tablet TAKE 1 TABLET(10 MG) BY MOUTH DAILY   No facility-administered encounter medications on file as of 11/04/2023.     Patient Active Problem List   Diagnosis Date Noted   HIV DISEASE 08/28/2006   HYPOTHYROIDISM 08/28/2006   HYPERTENSION 08/28/2006     Health Maintenance Due  Topic Date Due   COVID-19 Vaccine (1) Never done   Zoster Vaccines- Shingrix (1 of 2) Never done   Colonoscopy  09/25/2020     Review of Systems 12 point ros is negative except what is mentioned in hpi Physical Exam   BP 131/79   Pulse 68   Temp  98.3 F (36.8 C) (Oral)   Ht 4\' 11"  (1.499 m)   Wt 181 lb (82.1 kg)   SpO2 96%   BMI 36.56 kg/m   Physical Exam  Constitutional:  oriented to person, place, and time. appears well-developed and well-nourished. No distress.  HENT: [place]/AT, PERRLA, no scleral icterus Mouth/Throat: Oropharynx is clear and moist. No oropharyngeal exudate.  Cardiovascular: Normal rate, regular rhythm and normal heart sounds. Exam reveals no gallop and no friction rub.  No murmur heard.  Pulmonary/Chest: Effort normal and breath sounds normal. No respiratory distress.  has no wheezes.  Neck = supple, no nuchal rigidity Abdominal: Soft. Bowel sounds are normal.  exhibits no distension. There is no tenderness.  Lymphadenopathy: no cervical adenopathy. No axillary adenopathy Neurological: alert and oriented to person, place, and time.  Skin: Skin is warm and dry. No rash noted. No erythema.  Psychiatric: a normal mood and affect.  behavior is normal.   Lab Results  Component Value Date   CD4TCELL 42 05/04/2023   Lab Results  Component Value Date   CD4TABS 1,402 05/04/2023   CD4TABS 1,491 10/30/2022   CD4TABS 1,461 05/01/2022   Lab Results  Component Value Date   HIV1RNAQUANT Not Detected 05/04/2023   Lab Results  Component Value Date   HEPBSAB NON-REACTIVE 02/28/2021   Lab Results  Component Value Date   LABRPR NON-REACTIVE 05/04/2023    CBC Lab Results  Component Value Date  WBC 9.3 05/04/2023   RBC 4.09 05/04/2023   HGB 13.4 05/04/2023   HCT 39.9 05/04/2023   PLT 234 05/04/2023   MCV 97.6 05/04/2023   MCH 32.8 05/04/2023   MCHC 33.6 05/04/2023   RDW 13.2 05/04/2023   LYMPHSABS 3,969 (H) 10/30/2022   MONOABS 0.5 02/15/2018   EOSABS 28 05/04/2023    BMET Lab Results  Component Value Date   NA 143 05/04/2023   K 3.5 05/04/2023   CL 103 05/04/2023   CO2 29 05/04/2023   GLUCOSE 92 05/04/2023   BUN 16 05/04/2023   CREATININE 0.93 05/04/2023   CALCIUM  9.4 05/04/2023   GFRNONAA  75 11/09/2019   GFRAA 86 11/09/2019      Assessment and Plan HIV disease= will get 6 month labs and check cd 4 count and viral load; give refills on biktarvy   Long term medication management = will check cr and cbc  Health maintenance = continue on rosuvastatin  and  Will get tdap; plan for flu shot in the fall. Has mammo next month

## 2023-11-05 LAB — T-HELPER CELL (CD4) - (RCID CLINIC ONLY)
CD4 % Helper T Cell: 41 % (ref 33–65)
CD4 T Cell Abs: 1260 /uL (ref 400–1790)

## 2023-11-06 LAB — CBC WITH DIFFERENTIAL/PLATELET
Absolute Lymphocytes: 2939 {cells}/uL (ref 850–3900)
Absolute Monocytes: 398 {cells}/uL (ref 200–950)
Basophils Absolute: 50 {cells}/uL (ref 0–200)
Basophils Relative: 0.7 %
Eosinophils Absolute: 28 {cells}/uL (ref 15–500)
Eosinophils Relative: 0.4 %
HCT: 40.1 % (ref 35.0–45.0)
Hemoglobin: 13.5 g/dL (ref 11.7–15.5)
MCH: 32.8 pg (ref 27.0–33.0)
MCHC: 33.7 g/dL (ref 32.0–36.0)
MCV: 97.6 fL (ref 80.0–100.0)
MPV: 11.2 fL (ref 7.5–12.5)
Monocytes Relative: 5.6 %
Neutro Abs: 3685 {cells}/uL (ref 1500–7800)
Neutrophils Relative %: 51.9 %
Platelets: 207 10*3/uL (ref 140–400)
RBC: 4.11 10*6/uL (ref 3.80–5.10)
RDW: 13.1 % (ref 11.0–15.0)
Total Lymphocyte: 41.4 %
WBC: 7.1 10*3/uL (ref 3.8–10.8)

## 2023-11-06 LAB — COMPLETE METABOLIC PANEL WITHOUT GFR
AG Ratio: 1.5 (calc) (ref 1.0–2.5)
ALT: 9 U/L (ref 6–29)
AST: 17 U/L (ref 10–35)
Albumin: 4.5 g/dL (ref 3.6–5.1)
Alkaline phosphatase (APISO): 65 U/L (ref 37–153)
BUN: 10 mg/dL (ref 7–25)
CO2: 27 mmol/L (ref 20–32)
Calcium: 9.6 mg/dL (ref 8.6–10.4)
Chloride: 103 mmol/L (ref 98–110)
Creat: 0.92 mg/dL (ref 0.60–1.00)
Globulin: 3 g/dL (ref 1.9–3.7)
Glucose, Bld: 98 mg/dL (ref 65–99)
Potassium: 3.6 mmol/L (ref 3.5–5.3)
Sodium: 143 mmol/L (ref 135–146)
Total Bilirubin: 0.6 mg/dL (ref 0.2–1.2)
Total Protein: 7.5 g/dL (ref 6.1–8.1)

## 2023-11-06 LAB — HIV-1 RNA QUANT-NO REFLEX-BLD
HIV 1 RNA Quant: NOT DETECTED {copies}/mL
HIV-1 RNA Quant, Log: NOT DETECTED {Log_copies}/mL

## 2023-11-16 ENCOUNTER — Ambulatory Visit
Admission: RE | Admit: 2023-11-16 | Discharge: 2023-11-16 | Disposition: A | Discharge status: Home / Self Care | Source: Ambulatory Visit | Attending: Internal Medicine | Admitting: Internal Medicine

## 2023-11-16 DIAGNOSIS — Z1231 Encounter for screening mammogram for malignant neoplasm of breast: Secondary | ICD-10-CM

## 2024-01-27 ENCOUNTER — Ambulatory Visit

## 2024-01-27 ENCOUNTER — Other Ambulatory Visit: Payer: Self-pay

## 2024-03-02 ENCOUNTER — Other Ambulatory Visit: Payer: Self-pay

## 2024-03-02 MED ORDER — ROSUVASTATIN CALCIUM 10 MG PO TABS: 10.0000 mg | ORAL_TABLET | Freq: Every day | ORAL | 5 refills | Status: AC

## 2024-04-13 DIAGNOSIS — I5189 Other ill-defined heart diseases: Secondary | ICD-10-CM | POA: Diagnosis not present

## 2024-04-13 DIAGNOSIS — M8580 Other specified disorders of bone density and structure, unspecified site: Secondary | ICD-10-CM | POA: Diagnosis not present

## 2024-04-13 DIAGNOSIS — Z23 Encounter for immunization: Secondary | ICD-10-CM | POA: Diagnosis not present

## 2024-04-13 DIAGNOSIS — I10 Essential (primary) hypertension: Secondary | ICD-10-CM | POA: Diagnosis not present

## 2024-04-13 DIAGNOSIS — Z0000 Encounter for general adult medical examination without abnormal findings: Secondary | ICD-10-CM | POA: Diagnosis not present

## 2024-04-13 DIAGNOSIS — E039 Hypothyroidism, unspecified: Secondary | ICD-10-CM | POA: Diagnosis not present

## 2024-04-13 DIAGNOSIS — Z5181 Encounter for therapeutic drug level monitoring: Secondary | ICD-10-CM | POA: Diagnosis not present

## 2024-04-13 DIAGNOSIS — Z1331 Encounter for screening for depression: Secondary | ICD-10-CM | POA: Diagnosis not present

## 2024-04-13 DIAGNOSIS — B20 Human immunodeficiency virus [HIV] disease: Secondary | ICD-10-CM | POA: Diagnosis not present

## 2024-04-13 DIAGNOSIS — E7800 Pure hypercholesterolemia, unspecified: Secondary | ICD-10-CM | POA: Diagnosis not present

## 2024-04-13 DIAGNOSIS — R7303 Prediabetes: Secondary | ICD-10-CM | POA: Diagnosis not present

## 2024-04-14 ENCOUNTER — Other Ambulatory Visit (HOSPITAL_BASED_OUTPATIENT_CLINIC_OR_DEPARTMENT_OTHER): Payer: Self-pay | Admitting: Internal Medicine

## 2024-04-14 DIAGNOSIS — M8580 Other specified disorders of bone density and structure, unspecified site: Secondary | ICD-10-CM

## 2024-04-20 DIAGNOSIS — Z1211 Encounter for screening for malignant neoplasm of colon: Secondary | ICD-10-CM | POA: Diagnosis not present

## 2024-05-02 ENCOUNTER — Ambulatory Visit: Admitting: Internal Medicine

## 2024-05-09 ENCOUNTER — Encounter: Payer: Self-pay | Admitting: Internal Medicine

## 2024-05-09 ENCOUNTER — Ambulatory Visit: Admitting: Internal Medicine

## 2024-05-09 ENCOUNTER — Other Ambulatory Visit: Payer: Self-pay

## 2024-05-09 VITALS — BP 149/86 | HR 68 | Temp 97.5°F | Ht 60.0 in | Wt 191.0 lb

## 2024-05-09 DIAGNOSIS — Z79899 Other long term (current) drug therapy: Secondary | ICD-10-CM

## 2024-05-09 DIAGNOSIS — E039 Hypothyroidism, unspecified: Secondary | ICD-10-CM

## 2024-05-09 DIAGNOSIS — I10 Essential (primary) hypertension: Secondary | ICD-10-CM | POA: Diagnosis not present

## 2024-05-09 DIAGNOSIS — E038 Other specified hypothyroidism: Secondary | ICD-10-CM

## 2024-05-09 DIAGNOSIS — B20 Human immunodeficiency virus [HIV] disease: Secondary | ICD-10-CM | POA: Diagnosis not present

## 2024-05-09 NOTE — Progress Notes (Signed)
 RFV: follow up for hiv disease  Patient ID: Tamara Lucas, female   DOB: August 28, 1955, 68 y.o.   MRN: [id number]  HPI Tamara Lucas is 68 yo F with hiv disease, on biktarvy , CD 4 count of 1260/VL<20 in May 2025  Working out every day for 1 hour a day. Has lost 5 lb since last visit Saw her PCP mid October, her BP was under good control Not missing any doses biktarvy  Had flu vaccine this season, deferring covid vaccine  We discussed her future options with other hiv treatment regimens- [facility] there will be once a week pill for hiv treatment  Outpatient Encounter Medications as of 05/09/2024  Medication Sig   acetaminophen (TYLENOL) 325 MG tablet Take 650 mg by mouth every 6 (six) hours as needed for mild pain, moderate pain, fever or headache.    bictegravir-emtricitabine-tenofovir  AF (BIKTARVY ) 50-200-25 MG TABS tablet Take 1 tablet by mouth daily.   Calcium  Carbonate-Vit D-Min (CALTRATE 600+D PLUS MINERALS) 600-800 MG-UNIT TABS Take 1 tablet by mouth daily.   levothyroxine  (SYNTHROID , LEVOTHROID) 75 MCG tablet Take 75 mcg by mouth daily before breakfast.    losartan -hydrochlorothiazide  (HYZAAR ) 50-12.5 MG tablet Take 1 tablet by mouth daily.   potassium chloride  (KLOR-CON  M) 10 MEQ tablet TAKE 2 TABLETS BY MOUTH FOR 1ST DOSE, THEN 1 TABLET DAILY.   rosuvastatin  (CRESTOR ) 10 MG tablet Take 1 tablet (10 mg total) by mouth daily.   [DISCONTINUED] furosemide (LASIX) 20 MG tablet 1 tablet Orally Once a day for 90 days   No facility-administered encounter medications on file as of 05/09/2024.     Patient Active Problem List   Diagnosis Date Noted   HIV DISEASE 08/28/2006   HYPOTHYROIDISM 08/28/2006   HYPERTENSION 08/28/2006     Health Maintenance Due  Topic Date Due   COVID-19 Vaccine (1) Never done   Zoster Vaccines- Shingrix (1 of 2) Never done   Colonoscopy  09/25/2020   Medicare Annual Wellness (AWV)  04/06/2024     Review of Systems Review of Systems  Constitutional:  Negative for fever, chills, diaphoresis, activity change, appetite change, fatigue and unexpected weight change.  HENT: Negative for congestion, sore throat, rhinorrhea, sneezing, trouble swallowing and sinus pressure.  Eyes: Negative for photophobia and visual disturbance.  Respiratory: Negative for cough, chest tightness, shortness of breath, wheezing and stridor.  Cardiovascular: Negative for chest pain, palpitations and leg swelling.  Gastrointestinal: Negative for nausea, vomiting, abdominal pain, diarrhea, constipation, blood in stool, abdominal distention and anal bleeding.  Genitourinary: Negative for dysuria, hematuria, flank pain and difficulty urinating.  Musculoskeletal: Negative for myalgias, back pain, joint swelling, arthralgias and gait problem.  Skin: Negative for color change, pallor, rash and wound.  Neurological: Negative for dizziness, tremors, weakness and light-headedness.  Hematological: Negative for adenopathy. Does not bruise/bleed easily.  Psychiatric/Behavioral: Negative for behavioral problems, confusion, sleep disturbance, dysphoric mood, decreased concentration and agitation.   Physical Exam   BP (!) 148/83   Pulse 68   Temp (!) 97.5 F (36.4 C) (Oral)   Ht 5' (1.524 m)   Wt 191 lb (86.6 kg)   SpO2 96%   BMI 37.30 kg/m   Physical Exam  Constitutional:  oriented to person, place, and time. appears well-developed and well-nourished. No distress.  HENT: [place]/AT, PERRLA, no scleral icterus Mouth/Throat: Oropharynx is clear and moist. No oropharyngeal exudate.  Cardiovascular: Normal rate, regular rhythm and normal heart sounds. Exam reveals no gallop and no friction rub.  No murmur heard.  Pulmonary/Chest: Effort normal  and breath sounds normal. No respiratory distress.  has no wheezes.  Neck = supple, no nuchal rigidity Abdominal: Soft. Bowel sounds are normal.  exhibits no distension. There is no tenderness.  Lymphadenopathy: no cervical adenopathy. No  axillary adenopathy Neurological: alert and oriented to person, place, and time.  Skin: Skin is warm and dry. No rash noted. No erythema.  Psychiatric: a normal mood and affect.  behavior is normal.   Lab Results  Component Value Date   CD4TCELL 41 11/04/2023   Lab Results  Component Value Date   CD4TABS 1,260 11/04/2023   CD4TABS 1,402 05/04/2023   CD4TABS 1,491 10/30/2022   Lab Results  Component Value Date   HIV1RNAQUANT NOT DETECTED 11/04/2023   Lab Results  Component Value Date   HEPBSAB NON-REACTIVE 02/28/2021   Lab Results  Component Value Date   LABRPR NON-REACTIVE 05/04/2023    CBC Lab Results  Component Value Date   WBC 7.1 11/04/2023   RBC 4.11 11/04/2023   HGB 13.5 11/04/2023   HCT 40.1 11/04/2023   PLT 207 11/04/2023   MCV 97.6 11/04/2023   MCH 32.8 11/04/2023   MCHC 33.7 11/04/2023   RDW 13.1 11/04/2023   LYMPHSABS 3,969 (H) 10/30/2022   MONOABS 0.5 02/15/2018   EOSABS 28 11/04/2023    BMET Lab Results  Component Value Date   NA 143 11/04/2023   K 3.6 11/04/2023   CL 103 11/04/2023   CO2 27 11/04/2023   GLUCOSE 98 11/04/2023   BUN 10 11/04/2023   CREATININE 0.92 11/04/2023   CALCIUM  9.6 11/04/2023   GFRNONAA 75 11/09/2019   GFRAA 86 11/09/2019      Assessment and Plan  HIV disease = continue biktarvy  for now. Will check cd 4 count and VL  Long term medication = reviewed her labs from pcp which showed cr stable  Hypertension = will repeat BP to see if she is good control/ suspect white coat syndrome  Hypothyroidism = no new symptoms. Continue on current dose of synthroid   Health maintenance = uptodate.  Rtc in  61month

## 2024-05-10 LAB — T-HELPER CELL (CD4) - (RCID CLINIC ONLY)
CD4 % Helper T Cell: 41 % (ref 33–65)
CD4 T Cell Abs: 1272 /uL (ref 400–1790)

## 2024-05-11 LAB — HIV-1 RNA QUANT-NO REFLEX-BLD
HIV 1 RNA Quant: 102 {copies}/mL — ABNORMAL HIGH
HIV-1 RNA Quant, Log: 2.01 {Log_copies}/mL — ABNORMAL HIGH

## 2024-07-12 NOTE — Progress Notes (Signed)
 The ASCVD Risk score (Arnett DK, et al., 2019) failed to calculate for the following reasons:   The valid total cholesterol range is 130 to 320 mg/dL  Arlon Bergamo, BSN, RN

## 2024-11-07 ENCOUNTER — Ambulatory Visit: Admitting: Internal Medicine
